# Patient Record
Sex: Male | Born: 1965 | Race: White | Hispanic: No | Marital: Married | Smoking: Never smoker
Health system: Southern US, Community
[De-identification: ages and names within clinical notes are randomized; demographics above are authoritative.]

## PROBLEM LIST (undated history)

## (undated) DIAGNOSIS — R011 Cardiac murmur, unspecified: Secondary | ICD-10-CM

## (undated) DIAGNOSIS — E119 Type 2 diabetes mellitus without complications: Secondary | ICD-10-CM

## (undated) DIAGNOSIS — I498 Other specified cardiac arrhythmias: Secondary | ICD-10-CM

## (undated) DIAGNOSIS — E785 Hyperlipidemia, unspecified: Secondary | ICD-10-CM

## (undated) DIAGNOSIS — I10 Essential (primary) hypertension: Secondary | ICD-10-CM

## (undated) HISTORY — PX: TONSILLECTOMY: SUR1361

## (undated) HISTORY — DX: Hyperlipidemia, unspecified: E78.5

## (undated) HISTORY — DX: Essential (primary) hypertension: I10

## (undated) HISTORY — DX: Cardiac murmur, unspecified: R01.1

## (undated) HISTORY — PX: APPENDECTOMY: SHX54

## (undated) HISTORY — DX: Type 2 diabetes mellitus without complications: E11.9

## (undated) HISTORY — DX: Other specified cardiac arrhythmias: I49.8

---

## 2011-02-12 ENCOUNTER — Emergency Department (HOSPITAL_COMMUNITY)
Admission: EM | Admit: 2011-02-12 | Discharge: 2011-02-12 | Discharge status: Absent without leave | Payer: 59 | Source: Home / Self Care | Attending: Emergency Medicine | Admitting: Emergency Medicine

## 2011-02-12 NOTE — ED Provider Notes (Signed)
The patient was never seen and left before triage.  Roque Lias, MD 02/12/11 [phone]

## 2012-08-26 ENCOUNTER — Other Ambulatory Visit: Payer: Self-pay | Admitting: Family Medicine

## 2012-08-28 ENCOUNTER — Other Ambulatory Visit: Payer: Self-pay | Admitting: Family Medicine

## 2012-08-28 NOTE — Telephone Encounter (Signed)
Med rf °

## 2012-12-12 ENCOUNTER — Telehealth: Payer: Self-pay | Admitting: Family Medicine

## 2012-12-12 MED ORDER — METFORMIN HCL 1000 MG PO TABS: 1000.0000 mg | ORAL_TABLET | Freq: Two times a day (BID) | ORAL | Status: DC

## 2012-12-12 NOTE — Telephone Encounter (Signed)
Rx Refilled  

## 2012-12-12 NOTE — Telephone Encounter (Signed)
Metformin HCL 1000 mg tab 1 BID #60 °

## 2013-01-09 ENCOUNTER — Other Ambulatory Visit: Payer: Self-pay | Admitting: Family Medicine

## 2013-01-26 ENCOUNTER — Other Ambulatory Visit: Payer: Self-pay | Admitting: Family Medicine

## 2013-01-29 ENCOUNTER — Other Ambulatory Visit: Payer: Self-pay | Admitting: Family Medicine

## 2013-01-29 MED ORDER — METFORMIN HCL 1000 MG PO TABS: 1000.0000 mg | ORAL_TABLET | Freq: Two times a day (BID) | ORAL | Status: DC

## 2013-01-29 NOTE — Telephone Encounter (Signed)
Rx Refilled  

## 2013-02-07 ENCOUNTER — Ambulatory Visit: Payer: Self-pay | Admitting: Physician Assistant

## 2013-03-19 ENCOUNTER — Other Ambulatory Visit: Payer: Self-pay | Admitting: Family Medicine

## 2013-04-02 ENCOUNTER — Other Ambulatory Visit: Payer: Self-pay | Admitting: Family Medicine

## 2013-04-02 MED ORDER — PERMETHRIN 5 % EX CREA: 1.0000 "application " | TOPICAL_CREAM | Freq: Once | CUTANEOUS | Status: DC

## 2013-04-11 ENCOUNTER — Encounter: Payer: Self-pay | Admitting: Family Medicine

## 2013-04-11 ENCOUNTER — Ambulatory Visit (INDEPENDENT_AMBULATORY_CARE_PROVIDER_SITE_OTHER): Payer: 59 | Admitting: Family Medicine

## 2013-04-11 VITALS — BP 110/80 | HR 68 | Temp 97.1°F | Resp 18 | Ht 71.0 in | Wt 272.0 lb

## 2013-04-11 DIAGNOSIS — E119 Type 2 diabetes mellitus without complications: Secondary | ICD-10-CM

## 2013-04-11 LAB — LIPID PANEL
CHOLESTEROL: 188 mg/dL (ref 0–200)
HDL: 22 mg/dL — ABNORMAL LOW (ref >39)
LDL Cholesterol: 94 mg/dL (ref 0–99)
Total CHOL/HDL Ratio: 8.5 Ratio
Triglycerides: 361 mg/dL — ABNORMAL HIGH (ref <150)
VLDL: 72 mg/dL — ABNORMAL HIGH (ref 0–40)

## 2013-04-11 LAB — COMPLETE METABOLIC PANEL WITH GFR
ALBUMIN: 4.5 g/dL (ref 3.5–5.2)
ALT: 38 U/L (ref 0–53)
AST: 22 U/L (ref 0–37)
Alkaline Phosphatase: 70 U/L (ref 39–117)
BILIRUBIN TOTAL: 0.4 mg/dL (ref 0.3–1.2)
BUN: 12 mg/dL (ref 6–23)
CHLORIDE: 103 meq/L (ref 96–112)
CO2: 25 meq/L (ref 19–32)
Calcium: 9.3 mg/dL (ref 8.4–10.5)
Creat: 0.51 mg/dL (ref 0.50–1.35)
GLUCOSE: 140 mg/dL — AB (ref 70–99)
POTASSIUM: 4.7 meq/L (ref 3.5–5.3)
SODIUM: 141 meq/L (ref 135–145)
TOTAL PROTEIN: 6.8 g/dL (ref 6.0–8.3)

## 2013-04-11 LAB — HEMOGLOBIN A1C
HEMOGLOBIN A1C: 8.4 % — AB (ref <5.7)
MEAN PLASMA GLUCOSE: 194 mg/dL — AB (ref <117)

## 2013-04-11 NOTE — Progress Notes (Signed)
   Subjective:    Patient ID: Isaiah Beasley, male    DOB: [birth year], 48 y.o.   MRN: [id number]  HPI Patient comes in for recheck of his diabetes. Currently taking glipizide XL 10 mg by mouth daily, metformin 1000 mg by mouth twice a day, and victoza 18 mg subcutaneous daily.  His fasting blood sugars are 04/27/1938. His postprandial sugars are 150-160. He is not having any hypoglycemia." He has not experienced any weight loss since starting victoza.  He is also interested in medication for weight loss.  He states he has trouble controlling his appetite. He frequently eats and is not even hungry. Past Medical History  Diagnosis Date  . Diabetes mellitus without complication   . Hypertension   . Hyperlipidemia    Current Outpatient Prescriptions on File Prior to Visit  Medication Sig Dispense Refill  . acyclovir (ZOVIRAX) 400 MG tablet TAKE 1 TABLET TWICE A DAY  60 tablet  1  . glipiZIDE (GLUCOTROL XL) 10 MG 24 hr tablet TAKE 1 TABLET BY MOUTH DAILY  30 tablet  5  . metFORMIN (GLUCOPHAGE) 1000 MG tablet Take 1 tablet (1,000 mg total) by mouth 2 (two) times daily with a meal.  60 tablet  3  . VICTOZA 18 MG/3ML SOPN INJECT 1.8 MGS DAILY  9 pen  5   No current facility-administered medications on file prior to visit.   No Known Allergies History   Social History  . Marital Status: Married    Spouse Name: N/A    Number of Children: N/A  . Years of Education: N/A   Occupational History  . Not on file.   Social History Main Topics  . Smoking status: Never Smoker   . Smokeless tobacco: Not on file  . Alcohol Use: Yes     Comment: Rare  . Drug Use: No  . Sexual Activity: Not on file   Other Topics Concern  . Not on file   Social History Narrative  . No narrative on file      Review of Systems  All other systems reviewed and are negative.       Objective:   Physical Exam  Vitals reviewed. Constitutional: He appears well-developed and well-nourished. No distress.    HENT:  Mouth/Throat: Oropharynx is clear and moist. No oropharyngeal exudate.  Eyes: Conjunctivae are normal.  Neck: Neck supple. No JVD present. No thyromegaly present.  Cardiovascular: Normal rate, regular rhythm, normal heart sounds and intact distal pulses.  Exam reveals no gallop and no friction rub.   No murmur heard. Pulmonary/Chest: Effort normal and breath sounds normal. No respiratory distress. He has no wheezes. He has no rales. He exhibits no tenderness.  Abdominal: Soft. Bowel sounds are normal. He exhibits no distension. There is no tenderness. There is no rebound and no guarding.  Musculoskeletal: He exhibits no edema.  Lymphadenopathy:    He has no cervical adenopathy.  Skin: He is not diaphoretic.          Assessment & Plan:  1. Type II or unspecified type diabetes mellitus without mention of complication, not stated as uncontrolled Discontinue glipizide.  Replace with Jardience 25 mg poqday.  Recheck hemoglobin A1c in 3 months on medication. He is not able to tolerate the medication, I would resume glipizide and start the patient on either topamax or adipex as an appetite suppressant. - COMPLETE METABOLIC PANEL WITH GFR - Lipid panel - Hemoglobin A1c

## 2013-05-02 ENCOUNTER — Telehealth: Payer: Self-pay | Admitting: *Deleted

## 2013-05-02 ENCOUNTER — Other Ambulatory Visit: Payer: Self-pay | Admitting: Family Medicine

## 2013-05-02 MED ORDER — EMPAGLIFLOZIN 25 MG PO TABS: 25.0000 mg | ORAL_TABLET | Freq: Every day | ORAL | Status: DC

## 2013-05-02 NOTE — Telephone Encounter (Signed)
Sure, I will rx but he needs to get the rebate card we have to make it free, otherwise it will be very expensive.

## 2013-05-02 NOTE — Telephone Encounter (Signed)
Pt called stating that you had given him samples of Jardiance at last ov, wants to know if you can write a prescription for him or call it in at his pharmacy?

## 2013-05-03 MED ORDER — EMPAGLIFLOZIN 25 MG PO TABS: 25.0000 mg | ORAL_TABLET | Freq: Every day | ORAL | Status: DC

## 2013-05-03 NOTE — Telephone Encounter (Signed)
Meds refilled.

## 2013-05-03 NOTE — Telephone Encounter (Signed)
Pt aware of message

## 2013-08-02 ENCOUNTER — Other Ambulatory Visit: Payer: 59

## 2013-08-02 DIAGNOSIS — Z0000 Encounter for general adult medical examination without abnormal findings: Secondary | ICD-10-CM

## 2013-08-02 DIAGNOSIS — Z79899 Other long term (current) drug therapy: Secondary | ICD-10-CM

## 2013-08-02 DIAGNOSIS — E119 Type 2 diabetes mellitus without complications: Secondary | ICD-10-CM

## 2013-08-02 LAB — COMPLETE METABOLIC PANEL WITH GFR
ALBUMIN: 4.3 g/dL (ref 3.5–5.2)
ALT: 19 U/L (ref 0–53)
AST: 15 U/L (ref 0–37)
Alkaline Phosphatase: 69 U/L (ref 39–117)
BUN: 13 mg/dL (ref 6–23)
CALCIUM: 8.9 mg/dL (ref 8.4–10.5)
CHLORIDE: 103 meq/L (ref 96–112)
CO2: 23 mEq/L (ref 19–32)
Creat: 0.58 mg/dL (ref 0.50–1.35)
GFR, Est Non African American: >89 mL/min
Glucose, Bld: 155 mg/dL — ABNORMAL HIGH (ref 70–99)
POTASSIUM: 4.3 meq/L (ref 3.5–5.3)
Sodium: 139 mEq/L (ref 135–145)
Total Bilirubin: 0.4 mg/dL (ref 0.2–1.2)
Total Protein: 6.6 g/dL (ref 6.0–8.3)

## 2013-08-02 LAB — HEMOGLOBIN A1C
Hgb A1c MFr Bld: 7.5 % — ABNORMAL HIGH (ref <5.7)
MEAN PLASMA GLUCOSE: 169 mg/dL — AB (ref <117)

## 2013-08-02 LAB — LIPID PANEL
Cholesterol: 210 mg/dL — ABNORMAL HIGH (ref 0–200)
HDL: 25 mg/dL — AB (ref >39)
Total CHOL/HDL Ratio: 8.4 Ratio
Triglycerides: 555 mg/dL — ABNORMAL HIGH (ref <150)

## 2013-08-09 ENCOUNTER — Encounter: Payer: Self-pay | Admitting: Family Medicine

## 2013-08-09 ENCOUNTER — Ambulatory Visit (INDEPENDENT_AMBULATORY_CARE_PROVIDER_SITE_OTHER): Payer: 59 | Admitting: Family Medicine

## 2013-08-09 VITALS — BP 110/70 | HR 84 | Temp 97.0°F | Resp 18 | Ht 71.0 in | Wt 265.0 lb

## 2013-08-09 DIAGNOSIS — E119 Type 2 diabetes mellitus without complications: Secondary | ICD-10-CM

## 2013-08-09 DIAGNOSIS — E781 Pure hyperglyceridemia: Secondary | ICD-10-CM

## 2013-08-09 MED ORDER — TOPIRAMATE 25 MG PO TABS: 50.0000 mg | ORAL_TABLET | Freq: Two times a day (BID) | ORAL | Status: DC

## 2013-08-09 NOTE — Progress Notes (Signed)
Subjective:    Patient ID: Isaiah Beasley, male    DOB: [birth year], 48 y.o.   MRN: [id number]  HPI 1/15 Patient comes in for recheck of his diabetes. Currently taking glipizide XL 10 mg by mouth daily, metformin 1000 mg by mouth twice a day, and victoza 18 mg subcutaneous daily.  His fasting blood sugars are 04/27/1938. His postprandial sugars are 150-160. He is not having any hypoglycemia." He has not experienced any weight loss since starting victoza.  He is also interested in medication for weight loss.  He states he has trouble controlling his appetite. He frequently eats and is not even hungry.  At that time, my plan was: 1. Type II or unspecified type diabetes mellitus without mention of complication, not stated as uncontrolled Discontinue glipizide.  Replace with Jardience 25 mg poqday.  Recheck hemoglobin A1c in 3 months on medication. He is not able to tolerate the medication, I would resume glipizide and start the patient on either topamax or adipex as an appetite suppressant. - COMPLETE METABOLIC PANEL WITH GFR - Lipid panel - Hemoglobin A1c  08/09/13 Patient has lost 7 pounds since his last office visit. His hemoglobin A1c has fallen from 8.4-7.5. Unfortunately is still high. The patient continues to keep when he is not hungry. He would like to start taking an appetite suppressant. Unfortunately his triglycerides did increase after we started the patient on jardiance as one could anticipate. Lab on 08/02/2013  Component Date Value Ref Range Status  . Cholesterol 08/02/2013 210* 0 - 200 mg/dL Final   Comment: ATP III Classification:                                < 200        mg/dL        Desirable                               200 - 239     mg/dL        Borderline High                               >= 240        mg/dL        High                             . Triglycerides 08/02/2013 555* <150 mg/dL Final  . HDL 08/02/2013 25* >39 mg/dL Final  . Total CHOL/HDL Ratio 08/02/2013  8.4   Final  . VLDL 08/02/2013 NOT CALC  0 - 40 mg/dL Final   Comment:                            Not calculated due to Triglyceride >400.                          Suggest ordering Direct LDL (Unit Code: [phone]).  . LDL Cholesterol 08/02/2013 NOT CALC  0 - 99 mg/dL Final   Comment:                            Not calculated due to Triglyceride >400.  Suggest ordering Direct LDL (Unit Code: [phone]).                                                     Total Cholesterol/HDL Ratio:CHD Risk                                                 Coronary Heart Disease Risk Table                                                                 Men       Women                                   1/2 Average Risk              3.4        3.3                                       Average Risk              5.0        4.4                                    2X Average Risk              9.6        7.1                                    3X Average Risk             23.4       11.0                          Use the calculated Patient Ratio above and the CHD Risk table                           to determine the patient's CHD Risk.                          ATP III Classification (LDL):                                < 100        mg/dL         Optimal  100 - 129     mg/dL         Near or Above Optimal                               130 - 159     mg/dL         Borderline High                               160 - 189     mg/dL         High                                > 190        mg/dL         Very High                             . Hemoglobin A1C 08/02/2013 7.5* <5.7 % Final   Comment:                                                                                                 According to the ADA Clinical Practice Recommendations for 2011, when                          HbA1c is used as a screening test:                                                       >=6.5%    Diagnostic of Diabetes Mellitus                                     (if abnormal result is confirmed)                                                     5.7-6.4%   Increased risk of developing Diabetes Mellitus                                                     References:Diagnosis and Classification of Diabetes Mellitus,Diabetes                          IWLN,9892,11(HERDE 1):S62-S69 and Standards of Medical Care in  Diabetes - 2011,Diabetes Care,2011,34 (Suppl 1):S11-S61.                             . Mean Plasma Glucose 08/02/2013 169* <117 mg/dL Final  . Sodium 08/02/2013 139  135 - 145 mEq/L Final  . Potassium 08/02/2013 4.3  3.5 - 5.3 mEq/L Final  . Chloride 08/02/2013 103  96 - 112 mEq/L Final  . CO2 08/02/2013 23  19 - 32 mEq/L Final  . Glucose, Bld 08/02/2013 155* 70 - 99 mg/dL Final  . BUN 08/02/2013 13  6 - 23 mg/dL Final  . Creat 08/02/2013 0.58  0.50 - 1.35 mg/dL Final  . Total Bilirubin 08/02/2013 0.4  0.2 - 1.2 mg/dL Final  . Alkaline Phosphatase 08/02/2013 69  39 - 117 U/L Final  . AST 08/02/2013 15  0 - 37 U/L Final  . ALT 08/02/2013 19  0 - 53 U/L Final  . Total Protein 08/02/2013 6.6  6.0 - 8.3 g/dL Final  . Albumin 08/02/2013 4.3  3.5 - 5.2 g/dL Final  . Calcium 08/02/2013 8.9  8.4 - 10.5 mg/dL Final  . GFR, Est African American 08/02/2013 >89   Final  . GFR, Est Non African American 08/02/2013 >89   Final   Comment:                            The estimated GFR is a calculation valid for adults (>=1 years old)                          that uses the CKD-EPI algorithm to adjust for age and sex. It is                            not to be used for children, pregnant women, hospitalized patients,                             patients on dialysis, or with rapidly changing kidney function.                          According to the NKDEP, eGFR >89 is normal, 60-89 shows mild                          impairment, 30-59 shows moderate  impairment, 15-29 shows severe                          impairment and <15 is ESRD.                               Past Medical History  Diagnosis Date  . Diabetes mellitus without complication   . Hypertension   . Hyperlipidemia    Current Outpatient Prescriptions on File Prior to Visit  Medication Sig Dispense Refill  . Empagliflozin (JARDIANCE) 25 MG TABS Take 25 mg by mouth daily.  30 tablet  11  . metFORMIN (GLUCOPHAGE) 1000 MG tablet Take 1 tablet (1,000 mg total) by mouth 2 (two) times daily with a meal.  60 tablet  3  . Omega-3 Fatty Acids (FISH OIL) 1000 MG CAPS Take by  mouth.      Marland Kitchen VICTOZA 18 MG/3ML SOPN INJECT 1.8 MGS DAILY  9 pen  5  . acyclovir (ZOVIRAX) 400 MG tablet TAKE 1 TABLET TWICE A DAY  60 tablet  1   No current facility-administered medications on file prior to visit.   No Known Allergies History   Social History  . Marital Status: Married    Spouse Name: N/A    Number of Children: N/A  . Years of Education: N/A   Occupational History  . Not on file.   Social History Main Topics  . Smoking status: Never Smoker   . Smokeless tobacco: Not on file  . Alcohol Use: Yes     Comment: Rare  . Drug Use: No  . Sexual Activity: Not on file   Other Topics Concern  . Not on file   Social History Narrative  . No narrative on file      Review of Systems  All other systems reviewed and are negative.      Objective:   Physical Exam  Vitals reviewed. Cardiovascular: Normal rate, regular rhythm, normal heart sounds and intact distal pulses.  Exam reveals no gallop and no friction rub.   No murmur heard. Pulmonary/Chest: Effort normal and breath sounds normal. No respiratory distress. He has no wheezes. He has no rales. He exhibits no tenderness.  Abdominal: Soft. Bowel sounds are normal. He exhibits no distension. There is no tenderness. There is no rebound and no guarding.          Assessment & Plan:  1. Type II or unspecified type diabetes  mellitus without mention of complication, not stated as uncontrolled Patient does not want to add any medicine at this time. He like to try to lose weight to see if he can get the remainder of his blood sugars under control.  Therefore, we are going to start patient on Topamax for weight loss.  Again Topamax 25 mg by mouth each bedtime and increase by 25 mg every week until he is taking 50 mg by mouth twice a day. Recheck weight in 3 months along with a hemoglobin A1c.  2. Hypertriglyceridemia Patient refuses any medication to help control his cholesterol. He wants to try losing weight first.

## 2013-09-27 ENCOUNTER — Other Ambulatory Visit: Payer: Self-pay | Admitting: Family Medicine

## 2014-01-08 ENCOUNTER — Other Ambulatory Visit: Payer: Self-pay | Admitting: Family Medicine

## 2014-01-29 ENCOUNTER — Encounter: Payer: Self-pay | Admitting: Family Medicine

## 2014-01-29 ENCOUNTER — Other Ambulatory Visit: Payer: Self-pay | Admitting: Family Medicine

## 2014-01-29 NOTE — Telephone Encounter (Signed)
Medication refill for one time only.  Patient needs to be seen.  Letter sent for patient to call and schedule 

## 2014-02-28 ENCOUNTER — Other Ambulatory Visit: Payer: 59

## 2014-02-28 DIAGNOSIS — E785 Hyperlipidemia, unspecified: Secondary | ICD-10-CM

## 2014-02-28 DIAGNOSIS — E119 Type 2 diabetes mellitus without complications: Secondary | ICD-10-CM

## 2014-02-28 DIAGNOSIS — Z79899 Other long term (current) drug therapy: Secondary | ICD-10-CM

## 2014-03-03 ENCOUNTER — Other Ambulatory Visit: Payer: Self-pay | Admitting: Family Medicine

## 2014-03-03 ENCOUNTER — Ambulatory Visit (INDEPENDENT_AMBULATORY_CARE_PROVIDER_SITE_OTHER): Payer: 59 | Admitting: Family Medicine

## 2014-03-03 ENCOUNTER — Other Ambulatory Visit: Payer: 59

## 2014-03-03 ENCOUNTER — Encounter: Payer: Self-pay | Admitting: Family Medicine

## 2014-03-03 VITALS — BP 110/68 | HR 88 | Temp 98.0°F | Resp 18 | Ht 71.0 in | Wt 262.0 lb

## 2014-03-03 DIAGNOSIS — E1165 Type 2 diabetes mellitus with hyperglycemia: Secondary | ICD-10-CM

## 2014-03-03 DIAGNOSIS — E119 Type 2 diabetes mellitus without complications: Secondary | ICD-10-CM

## 2014-03-03 DIAGNOSIS — R5382 Chronic fatigue, unspecified: Secondary | ICD-10-CM

## 2014-03-03 DIAGNOSIS — E781 Pure hyperglyceridemia: Secondary | ICD-10-CM

## 2014-03-03 DIAGNOSIS — IMO0002 Reserved for concepts with insufficient information to code with codable children: Secondary | ICD-10-CM

## 2014-03-03 LAB — LIPID PANEL
Cholesterol: 201 mg/dL — ABNORMAL HIGH (ref 0–200)
HDL: 30 mg/dL — ABNORMAL LOW (ref >39)
LDL Cholesterol: 104 mg/dL — ABNORMAL HIGH (ref 0–99)
Total CHOL/HDL Ratio: 6.7 Ratio
Triglycerides: 335 mg/dL — ABNORMAL HIGH (ref <150)
VLDL: 67 mg/dL — AB (ref 0–40)

## 2014-03-03 LAB — CBC WITH DIFFERENTIAL/PLATELET
BASOS ABS: 0.1 10*3/uL (ref 0.0–0.1)
BASOS PCT: 1 % (ref 0–1)
EOS ABS: 0.1 10*3/uL (ref 0.0–0.7)
Eosinophils Relative: 2 % (ref 0–5)
HCT: 46.9 % (ref 39.0–52.0)
Hemoglobin: 15.9 g/dL (ref 13.0–17.0)
Lymphocytes Relative: 38 % (ref 12–46)
Lymphs Abs: 2.7 10*3/uL (ref 0.7–4.0)
MCH: 29.6 pg (ref 26.0–34.0)
MCHC: 33.9 g/dL (ref 30.0–36.0)
MCV: 87.3 fL (ref 78.0–100.0)
MONO ABS: 0.6 10*3/uL (ref 0.1–1.0)
MONOS PCT: 9 % (ref 3–12)
MPV: 10.3 fL (ref 9.4–12.4)
NEUTROS PCT: 50 % (ref 43–77)
Neutro Abs: 3.5 10*3/uL (ref 1.7–7.7)
Platelets: 241 10*3/uL (ref 150–400)
RBC: 5.37 MIL/uL (ref 4.22–5.81)
RDW: 14.6 % (ref 11.5–15.5)
WBC: 7 10*3/uL (ref 4.0–10.5)

## 2014-03-03 LAB — COMPLETE METABOLIC PANEL WITH GFR
ALT: 25 U/L (ref 0–53)
AST: 17 U/L (ref 0–37)
Albumin: 4.3 g/dL (ref 3.5–5.2)
Alkaline Phosphatase: 65 U/L (ref 39–117)
BUN: 12 mg/dL (ref 6–23)
CALCIUM: 9.6 mg/dL (ref 8.4–10.5)
CO2: 25 mEq/L (ref 19–32)
Chloride: 103 mEq/L (ref 96–112)
Creat: 0.64 mg/dL (ref 0.50–1.35)
GFR, Est African American: >89 mL/min
GFR, Est Non African American: >89 mL/min
Glucose, Bld: 183 mg/dL — ABNORMAL HIGH (ref 70–99)
POTASSIUM: 4.7 meq/L (ref 3.5–5.3)
SODIUM: 142 meq/L (ref 135–145)
TOTAL PROTEIN: 6.6 g/dL (ref 6.0–8.3)
Total Bilirubin: 0.5 mg/dL (ref 0.2–1.2)

## 2014-03-03 LAB — HEMOGLOBIN A1C, FINGERSTICK: Hgb A1C (fingerstick): 7 % — ABNORMAL HIGH (ref <5.7)

## 2014-03-03 NOTE — Progress Notes (Signed)
Subjective:    Patient ID: Isaiah Beasley, male    DOB: [birth year], 48 y.o.   MRN: [id number]  HPI Patient is here today for follow-up of his chronic medical conditions. Patient's hemoglobin A1c has fallen to 7.0. Unfortunately he has not lost any weight. Patient discontinue Topamax due to side effects. He continues to complain of severe fatigue. He denies any erectile dysfunction or decreased libido. His wife has reported that he snores at night and has made gasping noises in his sleep. Patient has never had his thyroid or testosterone level checked. His blood pressures well controlled today. He is overdue for fasting lipid panel. He has a history of hypertriglyceridemia but he refuses to take any cholesterol medication. I am concerned that the jardiance may exacerbate his hyperlipidemia. He denies any polyuria, polydipsia, or blurred vision. His diabetic foot exam is up-to-date. He is due for diabetic eye exam. Past Medical History  Diagnosis Date  . Diabetes mellitus without complication   . Hypertension   . Hyperlipidemia    No past surgical history on file. Current Outpatient Prescriptions on File Prior to Visit  Medication Sig Dispense Refill  . acyclovir (ZOVIRAX) 400 MG tablet TAKE 1 TABLET TWICE A DAY 60 tablet 1  . Empagliflozin (JARDIANCE) 25 MG TABS Take 25 mg by mouth daily. 30 tablet 11  . metFORMIN (GLUCOPHAGE) 1000 MG tablet TAKE 1 TABLET BY MOUTH 2 TIMES DAILY WITH A MEAL. 60 tablet 0  . Omega-3 Fatty Acids (FISH OIL) 1000 MG CAPS Take by mouth.    . topiramate (TOPAMAX) 25 MG tablet TAKE 2 TABLETS (50 MG TOTAL) BY MOUTH 2 (TWO) TIMES DAILY. 120 tablet 3  . VICTOZA 18 MG/3ML SOPN INJECT 1.8 MGS DAILY 9 pen 5   No current facility-administered medications on file prior to visit.   No Known Allergies History   Social History  . Marital Status: Married    Spouse Name: N/A    Number of Children: N/A  . Years of Education: N/A   Occupational History  . Not on file.    Social History Main Topics  . Smoking status: Never Smoker   . Smokeless tobacco: Not on file  . Alcohol Use: Yes     Comment: Rare  . Drug Use: No  . Sexual Activity: Not on file   Other Topics Concern  . Not on file   Social History Narrative      Review of Systems  All other systems reviewed and are negative.      Objective:   Physical Exam  Constitutional: He appears well-developed and well-nourished.  Cardiovascular: Normal rate, regular rhythm, normal heart sounds and intact distal pulses.  Exam reveals no gallop and no friction rub.   No murmur heard. Pulmonary/Chest: Effort normal and breath sounds normal. No respiratory distress. He has no wheezes. He has no rales. He exhibits no tenderness.  Abdominal: Soft. Bowel sounds are normal. He exhibits no distension and no mass. There is no tenderness. There is no rebound and no guarding.  Vitals reviewed.         Assessment & Plan:  Chronic fatigue - Plan: TSH, Testosterone  Hypertriglyceridemia  Diabetes mellitus type II, uncontrolled  Patient's blood pressures well controlled. His diabetes is acceptable. I will check a fasting lipid panel. If his triglycerides are extremely elevated, we will need to discontinue jardiance and switch to tradjenta.  I have recommended diet exercise and weight loss. If the patient's lipids are extremely uncontrolled, I would  strongly recommend he try a cholesterol medication. The patient will consider it. Due to his fatigue will check a TSH and testosterone level. If those are normal, I would recommend scheduling the patient for a split-level sleep study to evaluate for obstructive sleep apnea. Also schedule the patient to see an opthalmologist.

## 2014-03-04 LAB — TESTOSTERONE: Testosterone: 158 ng/dL — ABNORMAL LOW (ref 300–890)

## 2014-03-04 LAB — TSH: TSH: 1.746 u[IU]/mL (ref 0.350–4.500)

## 2014-03-06 ENCOUNTER — Other Ambulatory Visit: Payer: Self-pay | Admitting: *Deleted

## 2014-03-06 DIAGNOSIS — R7989 Other specified abnormal findings of blood chemistry: Secondary | ICD-10-CM

## 2014-03-07 ENCOUNTER — Other Ambulatory Visit: Payer: 59

## 2014-03-07 DIAGNOSIS — R7989 Other specified abnormal findings of blood chemistry: Secondary | ICD-10-CM

## 2014-03-08 LAB — FSH/LH
FSH: 4.8 m[IU]/mL (ref 1.4–18.1)
LH: 2.1 m[IU]/mL (ref 1.5–9.3)

## 2014-03-08 LAB — PROLACTIN: Prolactin: 4.4 ng/mL (ref 2.1–17.1)

## 2014-03-08 LAB — TESTOSTERONE: Testosterone: 159 ng/dL — ABNORMAL LOW (ref 300–890)

## 2014-03-11 ENCOUNTER — Telehealth: Payer: Self-pay | Admitting: *Deleted

## 2014-03-11 NOTE — Telephone Encounter (Signed)
Recieved fax from [facility] eye care stating that pt states has a eye doctor already and had diabetic exam done earlier this year, I called pt to find out where he had done and stated was done at [facility], I called and spoke with Juliann Pulse at [facility] and she is going to fax over his last ov and results tomorrow morning.

## 2014-03-24 ENCOUNTER — Encounter: Payer: Self-pay | Admitting: Family Medicine

## 2014-03-24 ENCOUNTER — Other Ambulatory Visit: Payer: Self-pay | Admitting: Family Medicine

## 2014-03-24 ENCOUNTER — Ambulatory Visit (INDEPENDENT_AMBULATORY_CARE_PROVIDER_SITE_OTHER): Payer: 59 | Admitting: Family Medicine

## 2014-03-24 VITALS — BP 126/72 | HR 92 | Temp 98.0°F | Resp 18 | Ht 71.0 in | Wt 266.0 lb

## 2014-03-24 DIAGNOSIS — E291 Testicular hypofunction: Secondary | ICD-10-CM

## 2014-03-24 MED ORDER — TESTOSTERONE CYPIONATE 200 MG/ML IM SOLN: 200.0000 mg | INTRAMUSCULAR | Status: DC

## 2014-03-24 NOTE — Telephone Encounter (Signed)
Refill appropriate and filled per protocol. 

## 2014-03-24 NOTE — Progress Notes (Signed)
   Subjective:    Patient ID: Isaiah Beasley, male    DOB: 20-Sep-1965, 48 y.o.   MRN: [id number]  HPI Before meals my previous office visits. Patient is a very pleasant 48 year old white male with a history of metabolic syndrome, type 2 diabetes mellitus, dyslipidemia, and obesity. He's been complaining about severe fatigue, apathy, low energy for several months. Recently checked a total testosterone level that was low at 158. A repeat testosterone level confirmed at 159. Patient's [place] LH and prolactin levels were normal however. He is here today to discuss treatment options Past Medical History  Diagnosis Date  . Diabetes mellitus without complication   . Hypertension   . Hyperlipidemia    No past surgical history on file. Current Outpatient Prescriptions on File Prior to Visit  Medication Sig Dispense Refill  . acyclovir (ZOVIRAX) 400 MG tablet TAKE 1 TABLET TWICE A DAY 60 tablet 1  . Empagliflozin (JARDIANCE) 25 MG TABS Take 25 mg by mouth daily. 30 tablet 11  . metFORMIN (GLUCOPHAGE) 1000 MG tablet TAKE 1 TABLET BY MOUTH 2 TIMES DAILY WITH A MEAL. 60 tablet 0  . Omega-3 Fatty Acids (FISH OIL) 1000 MG CAPS Take by mouth.    Marland Kitchen VICTOZA 18 MG/3ML SOPN INJECT 1.8 MGS DAILY 9 pen 5   No current facility-administered medications on file prior to visit.   No Known Allergies History   Social History  . Marital Status: Married    Spouse Name: N/A    Number of Children: N/A  . Years of Education: N/A   Occupational History  . Not on file.   Social History Main Topics  . Smoking status: Never Smoker   . Smokeless tobacco: Not on file  . Alcohol Use: Yes     Comment: Rare  . Drug Use: No  . Sexual Activity: Not on file   Other Topics Concern  . Not on file   Social History Narrative      Review of Systems  All other systems reviewed and are negative.      Objective:   Physical Exam  Cardiovascular: Normal rate and regular rhythm.   No murmur heard. Pulmonary/Chest:  Effort normal and breath sounds normal. No respiratory distress. He has no wheezes. He has no rales.  Vitals reviewed.         Assessment & Plan:  Hypogonadism in male - Plan: testosterone cypionate (DEPOTESTOTERONE CYPIONATE) 200 MG/ML injection  Patient symptoms are consistent with hypogonadism. I will start the patient on testosterone cypionate injections 200 mg every 2 weeks IM. Recheck the patient in 3 months. At that time I like to check a CBC, PSA, testosterone level, hemoglobin A1c, and fasting lipid panel. I discussed the risk of cardiovascular disease, prostate cancer, and other side effects of testosterone and the patient would like to proceed with therapy.

## 2014-04-27 ENCOUNTER — Other Ambulatory Visit: Payer: Self-pay | Admitting: Family Medicine

## 2014-06-05 ENCOUNTER — Other Ambulatory Visit: Payer: 59

## 2014-06-05 DIAGNOSIS — Z79899 Other long term (current) drug therapy: Secondary | ICD-10-CM

## 2014-06-05 DIAGNOSIS — E785 Hyperlipidemia, unspecified: Secondary | ICD-10-CM

## 2014-06-05 DIAGNOSIS — E119 Type 2 diabetes mellitus without complications: Secondary | ICD-10-CM

## 2014-06-05 DIAGNOSIS — R7989 Other specified abnormal findings of blood chemistry: Secondary | ICD-10-CM

## 2014-06-05 LAB — CBC WITH DIFFERENTIAL/PLATELET
BASOS ABS: 0.1 10*3/uL (ref 0.0–0.1)
Basophils Relative: 1 % (ref 0–1)
EOS ABS: 0.1 10*3/uL (ref 0.0–0.7)
Eosinophils Relative: 2 % (ref 0–5)
HCT: 48.9 % (ref 39.0–52.0)
HEMOGLOBIN: 16.7 g/dL (ref 13.0–17.0)
LYMPHS ABS: 2.2 10*3/uL (ref 0.7–4.0)
LYMPHS PCT: 37 % (ref 12–46)
MCH: 30.4 pg (ref 26.0–34.0)
MCHC: 34.2 g/dL (ref 30.0–36.0)
MCV: 89.1 fL (ref 78.0–100.0)
MONO ABS: 0.6 10*3/uL (ref 0.1–1.0)
MPV: 10.1 fL (ref 8.6–12.4)
Monocytes Relative: 11 % (ref 3–12)
NEUTROS PCT: 49 % (ref 43–77)
Neutro Abs: 2.9 10*3/uL (ref 1.7–7.7)
Platelets: 251 10*3/uL (ref 150–400)
RBC: 5.49 MIL/uL (ref 4.22–5.81)
RDW: 14.3 % (ref 11.5–15.5)
WBC: 5.9 10*3/uL (ref 4.0–10.5)

## 2014-06-05 LAB — LIPID PANEL
CHOLESTEROL: 166 mg/dL (ref 0–200)
HDL: 18 mg/dL — ABNORMAL LOW (ref >=40)
LDL CALC: 109 mg/dL — AB (ref 0–99)
Total CHOL/HDL Ratio: 9.2 Ratio
Triglycerides: 197 mg/dL — ABNORMAL HIGH (ref <150)
VLDL: 39 mg/dL (ref 0–40)

## 2014-06-06 LAB — PSA: PSA: 0.37 ng/mL (ref <=4.00)

## 2014-06-06 LAB — HEMOGLOBIN A1C
HEMOGLOBIN A1C: 7.4 % — AB (ref <5.7)
Mean Plasma Glucose: 166 mg/dL — ABNORMAL HIGH (ref <117)

## 2014-06-06 LAB — TESTOSTERONE: Testosterone: 209 ng/dL — ABNORMAL LOW (ref 300–890)

## 2014-06-10 ENCOUNTER — Encounter: Payer: Self-pay | Admitting: Family Medicine

## 2014-06-10 ENCOUNTER — Ambulatory Visit (INDEPENDENT_AMBULATORY_CARE_PROVIDER_SITE_OTHER): Payer: 59 | Admitting: Family Medicine

## 2014-06-10 VITALS — BP 118/74 | HR 84 | Temp 97.8°F | Resp 18 | Ht 71.0 in | Wt 263.0 lb

## 2014-06-10 DIAGNOSIS — E291 Testicular hypofunction: Secondary | ICD-10-CM

## 2014-06-10 DIAGNOSIS — E1165 Type 2 diabetes mellitus with hyperglycemia: Secondary | ICD-10-CM | POA: Diagnosis not present

## 2014-06-10 DIAGNOSIS — E781 Pure hyperglyceridemia: Secondary | ICD-10-CM

## 2014-06-10 DIAGNOSIS — IMO0002 Reserved for concepts with insufficient information to code with codable children: Secondary | ICD-10-CM

## 2014-06-10 NOTE — Progress Notes (Signed)
Subjective:    Patient ID: Isaiah Beasley, male    DOB: 07/29/1965, 49 y.o.   MRN: [id number]  HPI  Patient is here today to follow-up his diabetes, his hyper triglyceridemia, his dyslipidemia, and his hypogonadism. His most recent lab work as listed below: Lab on 06/05/2014  Component Date Value Ref Range Status  . Cholesterol 06/05/2014 166  0 - 200 mg/dL Final   Comment: ATP III Classification:       < 200        mg/dL        Desirable      200 - 239     mg/dL        Borderline High      >= 240        mg/dL        High     . Triglycerides 06/05/2014 197* <150 mg/dL Final  . HDL 06/05/2014 18* >=40 mg/dL Final   ** Please note change in reference range(s). **  . Total CHOL/HDL Ratio 06/05/2014 9.2   Final  . VLDL 06/05/2014 39  0 - 40 mg/dL Final  . LDL Cholesterol 06/05/2014 109* 0 - 99 mg/dL Final   Comment:   Total Cholesterol/HDL Ratio:CHD Risk                        Coronary Heart Disease Risk Table                                        Men       Women          1/2 Average Risk              3.4        3.3              Average Risk              5.0        4.4           2X Average Risk              9.6        7.1           3X Average Risk             23.4       11.0 Use the calculated Patient Ratio above and the CHD Risk table  to determine the patient's CHD Risk. ATP III Classification (LDL):       < 100        mg/dL         Optimal      100 - 129     mg/dL         Near or Above Optimal      130 - 159     mg/dL         Borderline High      160 - 189     mg/dL         High       > 190        mg/dL         Very High     . Hgb A1c MFr Bld 06/05/2014 7.4* <5.7 % Final   Comment:  According to the ADA Clinical Practice Recommendations for 2011, when HbA1c is used as a screening test:     >=6.5%   Diagnostic of Diabetes Mellitus            (if abnormal result is confirmed)   5.7-6.4%   Increased  risk of developing Diabetes Mellitus   References:Diagnosis and Classification of Diabetes Mellitus,Diabetes KGMW,1027,25(DGUYQ 1):S62-S69 and Standards of Medical Care in         Diabetes - 2011,Diabetes IHKV,4259,56 (Suppl 1):S11-S61.     . Mean Plasma Glucose 06/05/2014 166* <117 mg/dL Final  . WBC 06/05/2014 5.9  4.0 - 10.5 K/uL Final  . RBC 06/05/2014 5.49  4.22 - 5.81 MIL/uL Final  . Hemoglobin 06/05/2014 16.7  13.0 - 17.0 g/dL Final  . HCT 06/05/2014 48.9  39.0 - 52.0 % Final  . MCV 06/05/2014 89.1  78.0 - 100.0 fL Final  . MCH 06/05/2014 30.4  26.0 - 34.0 pg Final  . MCHC 06/05/2014 34.2  30.0 - 36.0 g/dL Final  . RDW 06/05/2014 14.3  11.5 - 15.5 % Final  . Platelets 06/05/2014 251  150 - 400 K/uL Final  . MPV 06/05/2014 10.1  8.6 - 12.4 fL Final  . Neutrophils Relative % 06/05/2014 49  43 - 77 % Final  . Neutro Abs 06/05/2014 2.9  1.7 - 7.7 K/uL Final  . Lymphocytes Relative 06/05/2014 37  12 - 46 % Final  . Lymphs Abs 06/05/2014 2.2  0.7 - 4.0 K/uL Final  . Monocytes Relative 06/05/2014 11  3 - 12 % Final  . Monocytes Absolute 06/05/2014 0.6  0.1 - 1.0 K/uL Final  . Eosinophils Relative 06/05/2014 2  0 - 5 % Final  . Eosinophils Absolute 06/05/2014 0.1  0.0 - 0.7 K/uL Final  . Basophils Relative 06/05/2014 1  0 - 1 % Final  . Basophils Absolute 06/05/2014 0.1  0.0 - 0.1 K/uL Final  . Smear Review 06/05/2014 Criteria for review not met   Final  . Testosterone 06/05/2014 209* 300 - 890 ng/dL Final   Comment:           [facility] Stage       Male              Male               I              < 30 ng/dL        < 10 ng/dL               II             < 150 ng/dL       < 30 ng/dL               III            100-320 ng/dL     < 35 ng/dL               IV             200-970 ng/dL     15-40 ng/dL               V/Adult        300-890 ng/dL     10-70 ng/dL     . PSA 06/05/2014 0.37  <=4.00 ng/mL Final   Comment: Test Methodology: ECLIA PSA (Electrochemiluminescence  Immunoassay)   For PSA values from 2.5-4.0, particularly in  younger men <31 years old, the AUA and NCCN suggest testing for % Free PSA (3515) and evaluation of the rate of increase in PSA (PSA velocity).    patient's fatigue is much improved on testosterone. In that his testosterone level is still slightly low, the patient feels much better. He denies problems with his libido. He would like to continue the testosterone at the current dose. Unfortunately his Hemovac A1c has not improved area that is stable at 7.4 and his triglycerides are much better. Unfortunately his HDL cholesterol has fallen to 18. His LDL cholesterol is acceptable at close to 100. Past Medical History  Diagnosis Date  . Diabetes mellitus without complication   . Hypertension   . Hyperlipidemia    No past surgical history on file. Current Outpatient Prescriptions on File Prior to Visit  Medication Sig Dispense Refill  . acyclovir (ZOVIRAX) 400 MG tablet TAKE 1 TABLET TWICE A DAY 60 tablet 1  . aspirin 81 MG tablet Take 81 mg by mouth daily.    . Empagliflozin (JARDIANCE) 25 MG TABS Take 25 mg by mouth daily. 30 tablet 11  . metFORMIN (GLUCOPHAGE) 1000 MG tablet Take 1 tablet (1,000 mg total) by mouth 2 (two) times daily with a meal. 60 tablet 3  . Omega-3 Fatty Acids (FISH OIL) 1000 MG CAPS Take by mouth.    . testosterone cypionate (DEPOTESTOTERONE CYPIONATE) 200 MG/ML injection Inject 1 mL (200 mg total) into the muscle every 14 (fourteen) days. 10 mL 1  . VICTOZA 18 MG/3ML SOPN INJECT 1.8 MGS SUBCUTANEOUSLY EVERY DAY 9 pen 5   No current facility-administered medications on file prior to visit.   No Known Allergies History   Social History  . Marital Status: Married    Spouse Name: N/A  . Number of Children: N/A  . Years of Education: N/A   Occupational History  . Not on file.   Social History Main Topics  . Smoking status: Never Smoker   . Smokeless tobacco: Not on file  . Alcohol Use: Yes      Comment: Rare  . Drug Use: No  . Sexual Activity: Not on file   Other Topics Concern  . Not on file   Social History Narrative      Review of Systems  All other systems reviewed and are negative.      Objective:   Physical Exam  Cardiovascular: Normal rate, regular rhythm and normal heart sounds.   No murmur heard. Pulmonary/Chest: Effort normal and breath sounds normal. No respiratory distress. He has no wheezes. He has no rales.  Abdominal: Soft. Bowel sounds are normal. He exhibits no distension. There is no tenderness. There is no rebound and no guarding.  Musculoskeletal: He exhibits no edema.  Vitals reviewed.         Assessment & Plan:  Hypogonadism in male  Hypertriglyceridemia  Diabetes mellitus type II, uncontrolled  I had a long discussion with the patient. His blood pressure is excellent. He is satisfied with the current dose of testosterone so we will make no changes. I offered the patient Actos 30 mg a day to help treat his diabetes. He would like to try to lose 15 pounds over the next 6 months and then recheck his A1c at that time first. If his A1c is still elevated in 6 months we will start Actos. I have also recommended starting niacin 500 mg 3 times a day with meals to help address his low HDL. I'm very happy with  the decline in his triglycerides. I've also recommended increasing aerobic exercise and monitoring his diet and consuming a low carbohydrate/low saturated fat diet.Marland Kitchen

## 2014-06-27 ENCOUNTER — Other Ambulatory Visit: Payer: Self-pay | Admitting: Family Medicine

## 2014-09-04 ENCOUNTER — Other Ambulatory Visit: Payer: Self-pay | Admitting: Family Medicine

## 2014-11-20 ENCOUNTER — Other Ambulatory Visit: Payer: Self-pay | Admitting: Family Medicine

## 2014-11-22 ENCOUNTER — Other Ambulatory Visit: Payer: Self-pay | Admitting: Family Medicine

## 2014-12-02 ENCOUNTER — Other Ambulatory Visit: Payer: 59

## 2014-12-02 DIAGNOSIS — E785 Hyperlipidemia, unspecified: Secondary | ICD-10-CM

## 2014-12-02 DIAGNOSIS — E1165 Type 2 diabetes mellitus with hyperglycemia: Secondary | ICD-10-CM

## 2014-12-02 DIAGNOSIS — IMO0002 Reserved for concepts with insufficient information to code with codable children: Secondary | ICD-10-CM

## 2014-12-02 DIAGNOSIS — Z79899 Other long term (current) drug therapy: Secondary | ICD-10-CM

## 2014-12-02 LAB — HEMOGLOBIN A1C
Hgb A1c MFr Bld: 8 % — ABNORMAL HIGH (ref <5.7)
MEAN PLASMA GLUCOSE: 183 mg/dL — AB (ref <117)

## 2014-12-02 LAB — COMPLETE METABOLIC PANEL WITH GFR
ALBUMIN: 3.8 g/dL (ref 3.6–5.1)
ALK PHOS: 47 U/L (ref 40–115)
ALT: 17 U/L (ref 9–46)
AST: 15 U/L (ref 10–35)
BUN: 11 mg/dL (ref 7–25)
CO2: 25 mmol/L (ref 20–31)
CREATININE: 0.52 mg/dL — AB (ref 0.70–1.33)
Calcium: 8.8 mg/dL (ref 8.6–10.3)
Chloride: 104 mmol/L (ref 98–110)
Glucose, Bld: 164 mg/dL — ABNORMAL HIGH (ref 70–99)
Potassium: 4.4 mmol/L (ref 3.5–5.3)
Sodium: 140 mmol/L (ref 135–146)
Total Bilirubin: 0.4 mg/dL (ref 0.2–1.2)
Total Protein: 6.1 g/dL (ref 6.1–8.1)

## 2014-12-02 LAB — LIPID PANEL
Cholesterol: 198 mg/dL (ref 125–200)
HDL: 20 mg/dL — AB (ref >=40)
LDL Cholesterol: 134 mg/dL — ABNORMAL HIGH (ref <130)
Total CHOL/HDL Ratio: 9.9 Ratio — ABNORMAL HIGH (ref <=5.0)
Triglycerides: 222 mg/dL — ABNORMAL HIGH (ref <150)
VLDL: 44 mg/dL — ABNORMAL HIGH (ref <30)

## 2014-12-05 ENCOUNTER — Encounter: Payer: Self-pay | Admitting: Family Medicine

## 2014-12-05 ENCOUNTER — Ambulatory Visit (INDEPENDENT_AMBULATORY_CARE_PROVIDER_SITE_OTHER): Payer: 59 | Admitting: Family Medicine

## 2014-12-05 VITALS — BP 110/68 | HR 84 | Temp 97.7°F | Resp 12 | Ht 71.0 in | Wt 258.0 lb

## 2014-12-05 DIAGNOSIS — Z23 Encounter for immunization: Secondary | ICD-10-CM | POA: Diagnosis not present

## 2014-12-05 DIAGNOSIS — E781 Pure hyperglyceridemia: Secondary | ICD-10-CM

## 2014-12-05 DIAGNOSIS — IMO0002 Reserved for concepts with insufficient information to code with codable children: Secondary | ICD-10-CM

## 2014-12-05 DIAGNOSIS — E1165 Type 2 diabetes mellitus with hyperglycemia: Secondary | ICD-10-CM

## 2014-12-05 DIAGNOSIS — R5382 Chronic fatigue, unspecified: Secondary | ICD-10-CM

## 2014-12-05 DIAGNOSIS — E291 Testicular hypofunction: Secondary | ICD-10-CM

## 2014-12-05 MED ORDER — TESTOSTERONE 40.5 MG/2.5GM (1.62%) TD GEL: 1.0000 "application " | Freq: Every day | TRANSDERMAL | Status: DC

## 2014-12-05 MED ORDER — GLIPIZIDE ER 10 MG PO TB24: 10.0000 mg | ORAL_TABLET | Freq: Every day | ORAL | Status: DC

## 2014-12-05 NOTE — Progress Notes (Signed)
Subjective:    Patient ID: Isaiah Beasley, male    DOB: Aug 17, 1965, 49 y.o.   MRN: [id number]  HPI  06/10/14 Patient is here today to follow-up his diabetes, his hyper triglyceridemia, his dyslipidemia, and his hypogonadism. His most recent lab work as listed below: Lab on 12/02/2014  Component Date Value Ref Range Status  . Sodium 12/02/2014 140  135 - 146 mmol/L Final  . Potassium 12/02/2014 4.4  3.5 - 5.3 mmol/L Final  . Chloride 12/02/2014 104  98 - 110 mmol/L Final  . CO2 12/02/2014 25  20 - 31 mmol/L Final  . Glucose, Bld 12/02/2014 164* 70 - 99 mg/dL Final  . BUN 12/02/2014 11  7 - 25 mg/dL Final  . Creat 12/02/2014 0.52* 0.70 - 1.33 mg/dL Final  . Total Bilirubin 12/02/2014 0.4  0.2 - 1.2 mg/dL Final  . Alkaline Phosphatase 12/02/2014 47  40 - 115 U/L Final  . AST 12/02/2014 15  10 - 35 U/L Final  . ALT 12/02/2014 17  9 - 46 U/L Final  . Total Protein 12/02/2014 6.1  6.1 - 8.1 g/dL Final  . Albumin 12/02/2014 3.8  3.6 - 5.1 g/dL Final  . Calcium 12/02/2014 8.8  8.6 - 10.3 mg/dL Final  . GFR, Est African American 12/02/2014 >89  >=60 mL/min Final  . GFR, Est Non African American 12/02/2014 >89  >=60 mL/min Final   Comment:   The estimated GFR is a calculation valid for adults (>=32 years old) that uses the CKD-EPI algorithm to adjust for age and sex. It is   not to be used for children, pregnant women, hospitalized patients,    patients on dialysis, or with rapidly changing kidney function. According to the NKDEP, eGFR >89 is normal, 60-89 shows mild impairment, 30-59 shows moderate impairment, 15-29 shows severe impairment and <15 is ESRD.     Marland Kitchen Cholesterol 12/02/2014 198  125 - 200 mg/dL Final  . Triglycerides 12/02/2014 222* <150 mg/dL Final  . HDL 12/02/2014 20* >=40 mg/dL Final  . Total CHOL/HDL Ratio 12/02/2014 9.9* <=5.0 Ratio Final  . VLDL 12/02/2014 44* <30 mg/dL Final  . LDL Cholesterol 12/02/2014 134* <130 mg/dL Final   Comment:   Total Cholesterol/HDL  Ratio:CHD Risk                        Coronary Heart Disease Risk Table                                        Men       Women          1/2 Average Risk              3.4        3.3              Average Risk              5.0        4.4           2X Average Risk              9.6        7.1           3X Average Risk             23.4       11.0 Use  the calculated Patient Ratio above and the CHD Risk table  to determine the patient's CHD Risk.   . Hgb A1c MFr Bld 12/02/2014 8.0* <5.7 % Final   Comment:                                                                        According to the ADA Clinical Practice Recommendations for 2011, when HbA1c is used as a screening test:     >=6.5%   Diagnostic of Diabetes Mellitus            (if abnormal result is confirmed)   5.7-6.4%   Increased risk of developing Diabetes Mellitus   References:Diagnosis and Classification of Diabetes Mellitus,Diabetes HYQM,5784,69(GEXBM 1):S62-S69 and Standards of Medical Care in         Diabetes - 2011,Diabetes WUXL,2440,10 (Suppl 1):S11-S61.     . Mean Plasma Glucose 12/02/2014 183* <117 mg/dL Final   Comment:   Footnotes:  (1) ** Please note change in unit of measure and reference range(s). **      patient's fatigue is much improved on testosterone. In that his testosterone level is still slightly low, the patient feels much better. He denies problems with his libido. He would like to continue the testosterone at the current dose. Unfortunately his Hemovac A1c has not improved area that is stable at 7.4 and his triglycerides are much better. Unfortunately his HDL cholesterol has fallen to 18. His LDL cholesterol is acceptable at close to 100.  At that time, my plan was: I had a long discussion with the patient. His blood pressure is excellent. He is satisfied with the current dose of testosterone so we will make no changes. I offered the patient Actos 30 mg a day to help treat his diabetes. He would like to try  to lose 15 pounds over the next 6 months and then recheck his A1c at that time first. If his A1c is still elevated in 6 months we will start Actos. I have also recommended starting niacin 500 mg 3 times a day with meals to help address his low HDL. I'm very happy with the decline in his triglycerides. I've also recommended increasing aerobic exercise and monitoring his diet and consuming a low carbohydrate/low saturated fat diet..  12/05/14 Patient is here today for follow-up. His most recent lab work as listed below: Lab on 12/02/2014  Component Date Value Ref Range Status  . Sodium 12/02/2014 140  135 - 146 mmol/L Final  . Potassium 12/02/2014 4.4  3.5 - 5.3 mmol/L Final  . Chloride 12/02/2014 104  98 - 110 mmol/L Final  . CO2 12/02/2014 25  20 - 31 mmol/L Final  . Glucose, Bld 12/02/2014 164* 70 - 99 mg/dL Final  . BUN 12/02/2014 11  7 - 25 mg/dL Final  . Creat 12/02/2014 0.52* 0.70 - 1.33 mg/dL Final  . Total Bilirubin 12/02/2014 0.4  0.2 - 1.2 mg/dL Final  . Alkaline Phosphatase 12/02/2014 47  40 - 115 U/L Final  . AST 12/02/2014 15  10 - 35 U/L Final  . ALT 12/02/2014 17  9 - 46 U/L Final  . Total Protein 12/02/2014 6.1  6.1 - 8.1 g/dL Final  . Albumin 12/02/2014 3.8  3.6 -  5.1 g/dL Final  . Calcium 12/02/2014 8.8  8.6 - 10.3 mg/dL Final  . GFR, Est African American 12/02/2014 >89  >=60 mL/min Final  . GFR, Est Non African American 12/02/2014 >89  >=60 mL/min Final   Comment:   The estimated GFR is a calculation valid for adults (>=30 years old) that uses the CKD-EPI algorithm to adjust for age and sex. It is   not to be used for children, pregnant women, hospitalized patients,    patients on dialysis, or with rapidly changing kidney function. According to the NKDEP, eGFR >89 is normal, 60-89 shows mild impairment, 30-59 shows moderate impairment, 15-29 shows severe impairment and <15 is ESRD.     Marland Kitchen Cholesterol 12/02/2014 198  125 - 200 mg/dL Final  . Triglycerides 12/02/2014  222* <150 mg/dL Final  . HDL 12/02/2014 20* >=40 mg/dL Final  . Total CHOL/HDL Ratio 12/02/2014 9.9* <=5.0 Ratio Final  . VLDL 12/02/2014 44* <30 mg/dL Final  . LDL Cholesterol 12/02/2014 134* <130 mg/dL Final   Comment:   Total Cholesterol/HDL Ratio:CHD Risk                        Coronary Heart Disease Risk Table                                        Men       Women          1/2 Average Risk              3.4        3.3              Average Risk              5.0        4.4           2X Average Risk              9.6        7.1           3X Average Risk             23.4       11.0 Use the calculated Patient Ratio above and the CHD Risk table  to determine the patient's CHD Risk.   . Hgb A1c MFr Bld 12/02/2014 8.0* <5.7 % Final   Comment:                                                                        According to the ADA Clinical Practice Recommendations for 2011, when HbA1c is used as a screening test:     >=6.5%   Diagnostic of Diabetes Mellitus            (if abnormal result is confirmed)   5.7-6.4%   Increased risk of developing Diabetes Mellitus   References:Diagnosis and Classification of Diabetes Mellitus,Diabetes HDQQ,2297,98(XQJJH 1):S62-S69 and Standards of Medical Care in         Diabetes - 2011,Diabetes ERDE,0814,48 (Suppl 1):S11-S61.     . Mean Plasma Glucose 12/02/2014 183* <117 mg/dL Final  Comment:   Footnotes:  (1) ** Please note change in unit of measure and reference range(s). **      Hemoglobin A1c has risen from 7.4 8.0. Patient admits that he has not been exercising or monitoring his diet. He also states that his fatigue has improved dramatically since starting testosterone injections. He really sees benefit from this. However the patient states that he feels good for 1 week after the injection and in the second week he feels extremely tired and moody. He is interested in trying to daily cream in order to maintain a more consistent testosterone  level within his body. Triglycerides have risen slightly since his last office visit. However the patient attributes that to dietary indiscretions he has been making over the summer. He states that he is been extremely busy at work and he has not been adhering to a low-fat low-carb diet like he should Past Medical History  Diagnosis Date  . Diabetes mellitus without complication   . Hypertension   . Hyperlipidemia    No past surgical history on file. Current Outpatient Prescriptions on File Prior to Visit  Medication Sig Dispense Refill  . acyclovir (ZOVIRAX) 400 MG tablet TAKE 1 TABLET TWICE A DAY 60 tablet 1  . aspirin 81 MG tablet Take 81 mg by mouth daily.    Marland Kitchen JARDIANCE 25 MG TABS tablet TAKE 1 TABLET EVERY DAY 30 tablet 3  . metFORMIN (GLUCOPHAGE) 1000 MG tablet TAKE 1 TABLET TWICE A DAY WITH A MEAL 60 tablet 3  . Omega-3 Fatty Acids (FISH OIL) 1000 MG CAPS Take by mouth.    Marland Kitchen VICTOZA 18 MG/3ML SOPN INJECT 1.8 MGS SUBCUTANEOUSLY EVERY DAY 9 pen 5  . testosterone cypionate (DEPOTESTOTERONE CYPIONATE) 200 MG/ML injection Inject 1 mL (200 mg total) into the muscle every 14 (fourteen) days. (Patient not taking: Reported on 12/05/2014) 10 mL 1   No current facility-administered medications on file prior to visit.   No Known Allergies Social History   Social History  . Marital Status: Married    Spouse Name: N/A  . Number of Children: N/A  . Years of Education: N/A   Occupational History  . Not on file.   Social History Main Topics  . Smoking status: Never Smoker   . Smokeless tobacco: Not on file  . Alcohol Use: Yes     Comment: Rare  . Drug Use: No  . Sexual Activity: Not on file   Other Topics Concern  . Not on file   Social History Narrative      Review of Systems  All other systems reviewed and are negative.      Objective:   Physical Exam  Cardiovascular: Normal rate, regular rhythm and normal heart sounds.   No murmur heard. Pulmonary/Chest: Effort normal  and breath sounds normal. No respiratory distress. He has no wheezes. He has no rales.  Abdominal: Soft. Bowel sounds are normal. He exhibits no distension. There is no tenderness. There is no rebound and no guarding.  Musculoskeletal: He exhibits no edema.  Vitals reviewed.         Assessment & Plan:  Hypogonadism in male - Plan: Testosterone (ANDROGEL) 40.5 MG/2.5GM (1.62%) GEL  Diabetes mellitus type II, uncontrolled - Plan: glipiZIDE (GLUCOTROL XL) 10 MG 24 hr tablet  Hypertriglyceridemia  Chronic fatigue patient seems to be doing better from the standpoint of his chronic fatigue after treatment of his hypogonadism. However due to the side effects he is experiencing, we will discontinue the  testosterone injections and switch the patient to androgel 1.62%, 1 prominent applied to the skin every day. Recheck in 3 months to see if this is working better for the patient. We had a long discussion today regarding his options for diabetes including resuming glipizide, adding Actos, or switching the patient to insulin. After a long discussion, the patient elects to resume glipizide extended release 10 mg by mouth daily. He also states he will try better to exercise more, lose weight, and monitor his diet. He would like to take the same approach as he is very recalcitrant taking any cholesterol medication as I have discussed in the past in his previous notes. Recheck in 3-6 months

## 2014-12-05 NOTE — Addendum Note (Signed)
Addended by: Shary Decamp B on: 12/05/2014 01:06 PM   Modules accepted: Orders

## 2014-12-08 ENCOUNTER — Telehealth: Payer: Self-pay | Admitting: Family Medicine

## 2014-12-08 NOTE — Telephone Encounter (Signed)
Patient called to let us know that his insurance will not cover the testosterone cream that was prescribed to him. The pharmacy faxed over another rx that the insurance company will cover. Pt would like to know if that rx will be called in or if he will have to come in to pick it up. Please call pt @ [phone] ext: [phone]

## 2014-12-08 NOTE — Telephone Encounter (Signed)
I have not seen anything in my box (I bet its up front).

## 2014-12-10 ENCOUNTER — Encounter: Payer: Self-pay | Admitting: Family Medicine

## 2014-12-11 ENCOUNTER — Telehealth: Payer: Self-pay | Admitting: Family Medicine

## 2014-12-11 MED ORDER — TESTOSTERONE 50 MG/5GM (1%) TD GEL: 5.0000 g | Freq: Every day | TRANSDERMAL | Status: DC

## 2014-12-11 NOTE — Telephone Encounter (Signed)
PA submitted through [email or web address]  

## 2014-12-11 NOTE — Telephone Encounter (Signed)
Insurance will cover Androderm and Testim.

## 2014-12-11 NOTE — Telephone Encounter (Signed)
Called and spoke to pt to inform that we have not seen anything yet - called pharm and talked to [place] and their computers are down she will call me back.

## 2014-12-11 NOTE — Telephone Encounter (Signed)
testim 50g applied to skin once a day.

## 2014-12-11 NOTE — Telephone Encounter (Signed)
Medication called/sent to requested pharmacy  

## 2014-12-19 ENCOUNTER — Encounter: Payer: Self-pay | Admitting: Family Medicine

## 2014-12-19 ENCOUNTER — Encounter: Payer: Self-pay | Admitting: *Deleted

## 2014-12-19 NOTE — Telephone Encounter (Signed)
Pt is aware via my-chart.

## 2014-12-19 NOTE — Telephone Encounter (Signed)
Received PA determination.   PA denied.   "Brand Testim can be approved if patient has two pre-treatment serum total testosterone levels less than 280 ng/dL"  Appeal letter faxed to [facility].

## 2014-12-31 NOTE — Telephone Encounter (Signed)
Approved through 12/19/15 - pt aware via mychart

## 2015-01-26 ENCOUNTER — Other Ambulatory Visit: Payer: Self-pay | Admitting: Family Medicine

## 2015-02-18 ENCOUNTER — Encounter: Payer: Self-pay | Admitting: Family Medicine

## 2015-02-18 MED ORDER — ACYCLOVIR 400 MG PO TABS: 400.0000 mg | ORAL_TABLET | Freq: Two times a day (BID) | ORAL | Status: DC

## 2015-02-27 ENCOUNTER — Other Ambulatory Visit: Payer: 59

## 2015-02-27 ENCOUNTER — Other Ambulatory Visit: Payer: Self-pay | Admitting: Family Medicine

## 2015-02-27 DIAGNOSIS — Z79890 Hormone replacement therapy (postmenopausal): Secondary | ICD-10-CM

## 2015-02-27 DIAGNOSIS — E291 Testicular hypofunction: Secondary | ICD-10-CM

## 2015-02-27 DIAGNOSIS — Z79899 Other long term (current) drug therapy: Secondary | ICD-10-CM

## 2015-02-28 LAB — TESTOSTERONE: TESTOSTERONE: 167 ng/dL — AB (ref 300–890)

## 2015-03-03 ENCOUNTER — Encounter: Payer: Self-pay | Admitting: Family Medicine

## 2015-03-03 ENCOUNTER — Ambulatory Visit (INDEPENDENT_AMBULATORY_CARE_PROVIDER_SITE_OTHER): Payer: 59 | Admitting: Family Medicine

## 2015-03-03 VITALS — BP 104/64 | HR 100 | Temp 97.7°F | Resp 20 | Ht 71.0 in | Wt 267.0 lb

## 2015-03-03 DIAGNOSIS — J019 Acute sinusitis, unspecified: Secondary | ICD-10-CM | POA: Diagnosis not present

## 2015-03-03 DIAGNOSIS — E119 Type 2 diabetes mellitus without complications: Secondary | ICD-10-CM

## 2015-03-03 MED ORDER — CEFDINIR 300 MG PO CAPS: 300.0000 mg | ORAL_CAPSULE | Freq: Two times a day (BID) | ORAL | Status: DC

## 2015-03-03 NOTE — Progress Notes (Signed)
Subjective:    Patient ID: Isaiah Beasley, male    DOB: 04/30/1965, 49 y.o.   MRN: [id number]  Headache    06/10/14 Patient is here today to follow-up his diabetes, his hyper triglyceridemia, his dyslipidemia, and his hypogonadism. patient's fatigue is much improved on testosterone. In that his testosterone level is still slightly low, the patient feels much better. He denies problems with his libido. He would like to continue the testosterone at the current dose. Unfortunately his Hemovac A1c has not improved area that is stable at 7.4 and his triglycerides are much better. Unfortunately his HDL cholesterol has fallen to 18. His LDL cholesterol is acceptable at close to 100.  At that time, my plan was: I had a long discussion with the patient. His blood pressure is excellent. He is satisfied with the current dose of testosterone so we will make no changes. I offered the patient Actos 30 mg a day to help treat his diabetes. He would like to try to lose 15 pounds over the next 6 months and then recheck his A1c at that time first. If his A1c is still elevated in 6 months we will start Actos. I have also recommended starting niacin 500 mg 3 times a day with meals to help address his low HDL. I'm very happy with the decline in his triglycerides. I've also recommended increasing aerobic exercise and monitoring his diet and consuming a low carbohydrate/low saturated fat diet..  12/05/14 Hemoglobin A1c has risen from 7.4 8.0. Patient admits that he has not been exercising or monitoring his diet. He also states that his fatigue has improved dramatically since starting testosterone injections. He really sees benefit from this. However the patient states that he feels good for 1 week after the injection and in the second week he feels extremely tired and moody. He is interested in trying to daily cream in order to maintain a more consistent testosterone level within his body. Triglycerides have risen slightly  since his last office visit. However the patient attributes that to dietary indiscretions he has been making over the summer. He states that he is been extremely busy at work and he has not been adhering to a low-fat low-carb diet like he should.  At that time, my plan was: patient seems to be doing better from the standpoint of his chronic fatigue after treatment of his hypogonadism. However due to the side effects he is experiencing, we will discontinue the testosterone injections and switch the patient to androgel 1.62%, 1 prominent applied to the skin every day. Recheck in 3 months to see if this is working better for the patient. We had a long discussion today regarding his options for diabetes including resuming glipizide, adding Actos, or switching the patient to insulin. After a long discussion, the patient elects to resume glipizide extended release 10 mg by mouth daily. He also states he will try better to exercise more, lose weight, and monitor his diet. He would like to take the same approach as he is very recalcitrant taking any cholesterol medication as I have discussed in the past in his previous notes. Recheck in 3-6 months  03/03/15 Patient is here today for his regularly scheduled follow-up for his diabetes mellitus. However he voices some concerning symptoms, the patient developed an upper respiratory infection. Ever since he has had severe pain located above and behind his left eye. At times the I will become red and teary raising the question of possible cluster headaches. At other times  the actual bone and orbital become tender to touch raising the possibility of a sinus infection. The headaches are not improving after 6 weeks. He denies any rhinorrhea. He denies any dizziness. However he has had 2 episodes where he forgot directions while driving. He was concerned because he was driving a route that he is taken thousands of times before. He's experienced no other episodes of memory loss or  personality changes aside from those 2 instances while driving. He denies any blurry vision or double vision. He denies any balance issues. He denies any syncope. He denies any neurologic deficit. Blood pressure today is well controlled at 104/64. He denies any chest pain shortness of breath or dyspnea on exertion. Past Medical History  Diagnosis Date  . Diabetes mellitus without complication ([place])   . Hypertension   . Hyperlipidemia    No past surgical history on file. Current Outpatient Prescriptions on File Prior to Visit  Medication Sig Dispense Refill  . acyclovir (ZOVIRAX) 400 MG tablet Take 1 tablet (400 mg total) by mouth 2 (two) times daily. 60 tablet 1  . aspirin 81 MG tablet Take 81 mg by mouth daily.    Marland Kitchen glipiZIDE (GLUCOTROL XL) 10 MG 24 hr tablet Take 1 tablet (10 mg total) by mouth daily with breakfast. 30 tablet 11  . JARDIANCE 25 MG TABS tablet TAKE 1 TABLET EVERY DAY 30 tablet 3  . metFORMIN (GLUCOPHAGE) 1000 MG tablet TAKE 1 TABLET TWICE A DAY WITH A MEAL 60 tablet 3  . Omega-3 Fatty Acids (FISH OIL) 1000 MG CAPS Take by mouth.    . testosterone (TESTIM) 50 MG/5GM (1%) GEL Place 5 g onto the skin daily. 1 Tube 5  . VICTOZA 18 MG/3ML SOPN INJECT 1.8 MGS SUBCUTANEOUSLY EVERY DAY 9 pen 5   No current facility-administered medications on file prior to visit.   No Known Allergies Social History   Social History  . Marital Status: Married    Spouse Name: N/A  . Number of Children: N/A  . Years of Education: N/A   Occupational History  . Not on file.   Social History Main Topics  . Smoking status: Never Smoker   . Smokeless tobacco: Not on file  . Alcohol Use: Yes     Comment: Rare  . Drug Use: No  . Sexual Activity: Not on file   Other Topics Concern  . Not on file   Social History Narrative      Review of Systems  Neurological: Positive for headaches.  All other systems reviewed and are negative.      Objective:   Physical Exam  Cardiovascular:  Normal rate, regular rhythm and normal heart sounds.   No murmur heard. Pulmonary/Chest: Effort normal and breath sounds normal. No respiratory distress. He has no wheezes. He has no rales.  Abdominal: Soft. Bowel sounds are normal. He exhibits no distension. There is no tenderness. There is no rebound and no guarding.  Musculoskeletal: He exhibits no edema.  Vitals reviewed.         Assessment & Plan:  Acute rhinosinusitis - Plan: cefdinir (OMNICEF) 300 MG capsule  Controlled type 2 diabetes mellitus without complication, without long-term current use of insulin (HCC) - Plan: Hemoglobin A1c, COMPLETE METABOLIC PANEL WITH GFR, Lipid panel  I will gladly check a hemoglobin A1c, CMP, and a fasting lipid panel to monitor for control of his type 2 diabetes and his dyslipidemia however I am very concerned about the headache he is having. Differential  diagnosis includes acute rhinosinusitis, neoplastic process in the orbit, cluster headache, acute angle-closure glaucoma.  Given the waxing and waning nature of his headache and eye pain I believe glaucoma is unlikely. I believe this is most likely a sinus infection and I will treat him with Omnicef 300 mg by mouth twice a day for 10 days. Recheck in one week. If not better, I will schedule the patient for a CT scan of the sinuses.

## 2015-03-04 LAB — LIPID PANEL
Cholesterol: 162 mg/dL (ref 125–200)
HDL: 18 mg/dL — ABNORMAL LOW (ref >=40)
LDL Cholesterol: 103 mg/dL (ref <130)
Total CHOL/HDL Ratio: 9 Ratio — ABNORMAL HIGH (ref <=5.0)
Triglycerides: 205 mg/dL — ABNORMAL HIGH (ref <150)
VLDL: 41 mg/dL — ABNORMAL HIGH (ref <30)

## 2015-03-04 LAB — HEMOGLOBIN A1C
Hgb A1c MFr Bld: 6.7 % — ABNORMAL HIGH (ref <5.7)
MEAN PLASMA GLUCOSE: 146 mg/dL — AB (ref <117)

## 2015-03-04 LAB — COMPREHENSIVE METABOLIC PANEL
ALK PHOS: 42 U/L (ref 40–115)
ALT: 27 U/L (ref 9–46)
AST: 22 U/L (ref 10–35)
Albumin: 3.8 g/dL (ref 3.6–5.1)
BILIRUBIN TOTAL: 0.4 mg/dL (ref 0.2–1.2)
BUN: 10 mg/dL (ref 7–25)
CALCIUM: 8.5 mg/dL — AB (ref 8.6–10.3)
CO2: 24 mmol/L (ref 20–31)
Chloride: 106 mmol/L (ref 98–110)
Creat: 0.62 mg/dL — ABNORMAL LOW (ref 0.70–1.33)
GLUCOSE: 133 mg/dL — AB (ref 70–99)
Potassium: 4.1 mmol/L (ref 3.5–5.3)
Sodium: 141 mmol/L (ref 135–146)
Total Protein: 5.9 g/dL — ABNORMAL LOW (ref 6.1–8.1)

## 2015-03-05 ENCOUNTER — Encounter: Payer: Self-pay | Admitting: Family Medicine

## 2015-03-11 ENCOUNTER — Other Ambulatory Visit: Payer: Self-pay | Admitting: Family Medicine

## 2015-03-11 MED ORDER — EMPAGLIFLOZIN 25 MG PO TABS: 25.0000 mg | ORAL_TABLET | Freq: Every day | ORAL | Status: DC

## 2015-04-02 ENCOUNTER — Ambulatory Visit (INDEPENDENT_AMBULATORY_CARE_PROVIDER_SITE_OTHER): Payer: 59 | Admitting: Family Medicine

## 2015-04-02 ENCOUNTER — Encounter: Payer: Self-pay | Admitting: Family Medicine

## 2015-04-02 VITALS — BP 128/84 | HR 87 | Temp 97.5°F | Resp 16 | Wt 266.0 lb

## 2015-04-02 DIAGNOSIS — I499 Cardiac arrhythmia, unspecified: Secondary | ICD-10-CM | POA: Diagnosis not present

## 2015-04-02 DIAGNOSIS — B009 Herpesviral infection, unspecified: Secondary | ICD-10-CM

## 2015-04-02 MED ORDER — VALACYCLOVIR HCL 500 MG PO TABS: 500.0000 mg | ORAL_TABLET | Freq: Two times a day (BID) | ORAL | Status: DC

## 2015-04-02 NOTE — Progress Notes (Signed)
Subjective:    Patient ID: Isaiah Beasley, male    DOB: 01-31-1966, 50 y.o.   MRN: [id number]  Headache    12/05/14 Hemoglobin A1c has risen from 7.4 8.0. Patient admits that he has not been exercising or monitoring his diet. He also states that his fatigue has improved dramatically since starting testosterone injections. He really sees benefit from this. However the patient states that he feels good for 1 week after the injection and in the second week he feels extremely tired and moody. He is interested in trying to daily cream in order to maintain a more consistent testosterone level within his body. Triglycerides have risen slightly since his last office visit. However the patient attributes that to dietary indiscretions he has been making over the summer. He states that he is been extremely busy at work and he has not been adhering to a low-fat low-carb diet like he should.  At that time, my plan was: patient seems to be doing better from the standpoint of his chronic fatigue after treatment of his hypogonadism. However due to the side effects he is experiencing, we will discontinue the testosterone injections and switch the patient to androgel 1.62%, 1 prominent applied to the skin every day. Recheck in 3 months to see if this is working better for the patient. We had a long discussion today regarding his options for diabetes including resuming glipizide, adding Actos, or switching the patient to insulin. After a long discussion, the patient elects to resume glipizide extended release 10 mg by mouth daily. He also states he will try better to exercise more, lose weight, and monitor his diet. He would like to take the same approach as he is very recalcitrant taking any cholesterol medication as I have discussed in the past in his previous notes. Recheck in 3-6 months  03/03/15 Patient is here today for his regularly scheduled follow-up for his diabetes mellitus. However he voices some concerning  symptoms, the patient developed an upper respiratory infection. Ever since he has had severe pain located above and behind his left eye. At times the I will become red and teary raising the question of possible cluster headaches. At other times the actual bone and orbital become tender to touch raising the possibility of a sinus infection. The headaches are not improving after 6 weeks. He denies any rhinorrhea. He denies any dizziness. However he has had 2 episodes where he forgot directions while driving. He was concerned because he was driving a route that he is taken thousands of times before. He's experienced no other episodes of memory loss or personality changes aside from those 2 instances while driving. He denies any blurry vision or double vision. He denies any balance issues. He denies any syncope. He denies any neurologic deficit. Blood pressure today is well controlled at 104/64. He denies any chest pain shortness of breath or dyspnea on exertion.  At that time, my plan was:  I will gladly check a hemoglobin A1c, CMP, and a fasting lipid panel to monitor for control of his type 2 diabetes and his dyslipidemia however I am very concerned about the headache he is having. Differential diagnosis includes acute rhinosinusitis, neoplastic process in the orbit, cluster headache, acute angle-closure glaucoma.  Given the waxing and waning nature of his headache and eye pain I believe glaucoma is unlikely. I believe this is most likely a sinus infection and I will treat him with Omnicef 300 mg by mouth twice a day for 10 days.  Recheck in one week. If not better, I will schedule the patient for a CT scan of the sinuses.  04/02/15 Symptoms gradually improved on abx.  Headaches have completely resolved.  No further memory issues.  Has a long-standing history of genital herpes. He takes acyclovir daily as a preventative. Recently he has had more frequent and longer lasting outbreaks. He is interested in switching to  Valtrex which his wife is used which seems to work better for her. Coincidentally on his exam today he has an irregularly irregular heartbeat. Patient is completely asymptomatic. He denies any chest pain shortness of breath syncope or near syncope Past Medical History  Diagnosis Date  . Diabetes mellitus without complication ([place])   . Hypertension   . Hyperlipidemia    No past surgical history on file. Current Outpatient Prescriptions on File Prior to Visit  Medication Sig Dispense Refill  . acyclovir (ZOVIRAX) 400 MG tablet Take 1 tablet (400 mg total) by mouth 2 (two) times daily. 60 tablet 1  . aspirin 81 MG tablet Take 81 mg by mouth daily.    . cefdinir (OMNICEF) 300 MG capsule Take 1 capsule (300 mg total) by mouth 2 (two) times daily. 20 capsule 0  . empagliflozin (JARDIANCE) 25 MG TABS tablet Take 25 mg by mouth daily. 30 tablet 3  . glipiZIDE (GLUCOTROL XL) 10 MG 24 hr tablet Take 1 tablet (10 mg total) by mouth daily with breakfast. 30 tablet 11  . metFORMIN (GLUCOPHAGE) 1000 MG tablet TAKE 1 TABLET TWICE A DAY WITH A MEAL 60 tablet 3  . Omega-3 Fatty Acids (FISH OIL) 1000 MG CAPS Take by mouth.    . testosterone (TESTIM) 50 MG/5GM (1%) GEL Place 5 g onto the skin daily. 1 Tube 5  . VICTOZA 18 MG/3ML SOPN INJECT 1.8 MGS SUBCUTANEOUSLY EVERY DAY 9 pen 5   No current facility-administered medications on file prior to visit.   No Known Allergies Social History   Social History  . Marital Status: Married    Spouse Name: N/A  . Number of Children: N/A  . Years of Education: N/A   Occupational History  . Not on file.   Social History Main Topics  . Smoking status: Never Smoker   . Smokeless tobacco: Not on file  . Alcohol Use: Yes     Comment: Rare  . Drug Use: No  . Sexual Activity: Not on file   Other Topics Concern  . Not on file   Social History Narrative      Review of Systems  All other systems reviewed and are negative.      Objective:   Physical  Exam  Cardiovascular: Normal rate and normal heart sounds.  An irregularly irregular rhythm present.  No murmur heard. Pulmonary/Chest: Effort normal and breath sounds normal. No respiratory distress. He has no wheezes. He has no rales.  Abdominal: Soft. Bowel sounds are normal. He exhibits no distension. There is no tenderness. There is no rebound and no guarding.  Musculoskeletal: He exhibits no edema.  Vitals reviewed.         Assessment & Plan:  Irregular heart beats  HSV (herpes simplex virus) infection  Switch acyclovir to Valtrex 500 mg by mouth daily. During outbreaks he can take 1000 mg by mouth twice a day for 3 days. I will also obtain an EKG to evaluate irregular heartbeat that I am finding on his physical exam. EKG today confirms sinus rhythm with a frequent pause but no AV block  I can appreciate consistent with marked sinus arrhythmia. I will schedule the patient for 24 hour Holter monitor to evaluate further to ensure that there is no AV block.

## 2015-04-03 ENCOUNTER — Ambulatory Visit (INDEPENDENT_AMBULATORY_CARE_PROVIDER_SITE_OTHER): Payer: 59 | Admitting: Family Medicine

## 2015-04-03 DIAGNOSIS — I499 Cardiac arrhythmia, unspecified: Secondary | ICD-10-CM

## 2015-04-03 NOTE — Progress Notes (Signed)
Pt had Holter Monitor placed yesterday, returns today for removal.  Denies any chest symptoms during this time.  Did states yellow lead popped off a couple times and has recorded this on his log sheet.  Recording to lab for analysis

## 2015-04-07 ENCOUNTER — Encounter: Payer: Self-pay | Admitting: Family Medicine

## 2015-04-20 ENCOUNTER — Encounter: Payer: Self-pay | Admitting: Family Medicine

## 2015-05-18 ENCOUNTER — Other Ambulatory Visit: Payer: Self-pay | Admitting: Family Medicine

## 2015-05-18 NOTE — Telephone Encounter (Signed)
Medication refilled per protocol. 

## 2015-06-04 ENCOUNTER — Encounter: Payer: Self-pay | Admitting: Family Medicine

## 2015-06-04 ENCOUNTER — Ambulatory Visit (INDEPENDENT_AMBULATORY_CARE_PROVIDER_SITE_OTHER): Payer: 59 | Admitting: Family Medicine

## 2015-06-04 VITALS — BP 126/78 | HR 80 | Temp 97.4°F | Resp 18 | Ht 71.0 in | Wt 266.0 lb

## 2015-06-04 DIAGNOSIS — E119 Type 2 diabetes mellitus without complications: Secondary | ICD-10-CM

## 2015-06-04 DIAGNOSIS — E291 Testicular hypofunction: Secondary | ICD-10-CM

## 2015-06-04 DIAGNOSIS — E781 Pure hyperglyceridemia: Secondary | ICD-10-CM | POA: Diagnosis not present

## 2015-06-04 DIAGNOSIS — Z23 Encounter for immunization: Secondary | ICD-10-CM | POA: Diagnosis not present

## 2015-06-04 LAB — COMPLETE METABOLIC PANEL WITH GFR
ALT: 19 U/L (ref 9–46)
AST: 14 U/L (ref 10–35)
Albumin: 4.2 g/dL (ref 3.6–5.1)
Alkaline Phosphatase: 50 U/L (ref 40–115)
BUN: 14 mg/dL (ref 7–25)
CALCIUM: 9.4 mg/dL (ref 8.6–10.3)
CHLORIDE: 104 mmol/L (ref 98–110)
CO2: 22 mmol/L (ref 20–31)
Creat: 0.6 mg/dL — ABNORMAL LOW (ref 0.70–1.33)
GFR, Est Non African American: >89 mL/min (ref >=60)
Glucose, Bld: 136 mg/dL — ABNORMAL HIGH (ref 70–99)
POTASSIUM: 4.4 mmol/L (ref 3.5–5.3)
Sodium: 139 mmol/L (ref 135–146)
Total Bilirubin: 0.5 mg/dL (ref 0.2–1.2)
Total Protein: 6.5 g/dL (ref 6.1–8.1)

## 2015-06-04 LAB — LIPID PANEL
CHOL/HDL RATIO: 9.4 ratio — AB (ref <=5.0)
CHOLESTEROL: 206 mg/dL — AB (ref 125–200)
HDL: 22 mg/dL — AB (ref >=40)
LDL CALC: 118 mg/dL (ref <130)
TRIGLYCERIDES: 329 mg/dL — AB (ref <150)
VLDL: 66 mg/dL — AB (ref <30)

## 2015-06-04 LAB — TESTOSTERONE: Testosterone: 170 ng/dL — ABNORMAL LOW (ref 250–827)

## 2015-06-04 NOTE — Addendum Note (Signed)
Addended by: Shary Decamp B on: 06/04/2015 11:45 AM   Modules accepted: Orders

## 2015-06-04 NOTE — Progress Notes (Signed)
Subjective:    Patient ID: Isaiah Beasley, male    DOB: 09/02/1965, 50 y.o.   MRN: [id number]  Headache    12/05/14 Hemoglobin A1c has risen from 7.4 8.0. Patient admits that he has not been exercising or monitoring his diet. He also states that his fatigue has improved dramatically since starting testosterone injections. He really sees benefit from this. However the patient states that he feels good for 1 week after the injection and in the second week he feels extremely tired and moody. He is interested in trying to daily cream in order to maintain a more consistent testosterone level within his body. Triglycerides have risen slightly since his last office visit. However the patient attributes that to dietary indiscretions he has been making over the summer. He states that he is been extremely busy at work and he has not been adhering to a low-fat low-carb diet like he should.  At that time, my plan was: patient seems to be doing better from the standpoint of his chronic fatigue after treatment of his hypogonadism. However due to the side effects he is experiencing, we will discontinue the testosterone injections and switch the patient to androgel 1.62%, 1 prominent applied to the skin every day. Recheck in 3 months to see if this is working better for the patient. We had a long discussion today regarding his options for diabetes including resuming glipizide, adding Actos, or switching the patient to insulin. After a long discussion, the patient elects to resume glipizide extended release 10 mg by mouth daily. He also states he will try better to exercise more, lose weight, and monitor his diet. He would like to take the same approach as he is very recalcitrant taking any cholesterol medication as I have discussed in the past in his previous notes. Recheck in 3-6 months  03/03/15 Patient is here today for his regularly scheduled follow-up for his diabetes mellitus. However he voices some concerning  symptoms, the patient developed an upper respiratory infection. Ever since he has had severe pain located above and behind his left eye. At times the I will become red and teary raising the question of possible cluster headaches. At other times the actual bone and orbital become tender to touch raising the possibility of a sinus infection. The headaches are not improving after 6 weeks. He denies any rhinorrhea. He denies any dizziness. However he has had 2 episodes where he forgot directions while driving. He was concerned because he was driving a route that he is taken thousands of times before. He's experienced no other episodes of memory loss or personality changes aside from those 2 instances while driving. He denies any blurry vision or double vision. He denies any balance issues. He denies any syncope. He denies any neurologic deficit. Blood pressure today is well controlled at 104/64. He denies any chest pain shortness of breath or dyspnea on exertion.  At that time, my plan was:  I will gladly check a hemoglobin A1c, CMP, and a fasting lipid panel to monitor for control of his type 2 diabetes and his dyslipidemia however I am very concerned about the headache he is having. Differential diagnosis includes acute rhinosinusitis, neoplastic process in the orbit, cluster headache, acute angle-closure glaucoma.  Given the waxing and waning nature of his headache and eye pain I believe glaucoma is unlikely. I believe this is most likely a sinus infection and I will treat him with Omnicef 300 mg by mouth twice a day for 10 days.  Recheck in one week. If not better, I will schedule the patient for a CT scan of the sinuses.  04/02/15 Symptoms gradually improved on abx.  Headaches have completely resolved.  No further memory issues.  Has a long-standing history of genital herpes. He takes acyclovir daily as a preventative. Recently he has had more frequent and longer lasting outbreaks. He is interested in switching to  Valtrex which his wife is used which seems to work better for her. Coincidentally on his exam today he has an irregularly irregular heartbeat. Patient is completely asymptomatic. He denies any chest pain shortness of breath syncope or near syncope.  At that time, my plan was: Switch acyclovir to Valtrex 500 mg by mouth daily. During outbreaks he can take 1000 mg by mouth twice a day for 3 days. I will also obtain an EKG to evaluate irregular heartbeat that I am finding on his physical exam. EKG today confirms sinus rhythm with a frequent pause but no AV block I can appreciate consistent with marked sinus arrhythmia. I will schedule the patient for 24 hour Holter monitor to evaluate further to ensure that there is no AV block.  06/04/15 Thankfully his had no further headaches. He did recently have a root canal on the left side. He is overdue for PSA. He is also due for colonoscopy but he does not want to have a colonoscopy. He is due for Pneumovax 23. Diabetic foot exam is performed today is completely normal. Diabetic eye exam was performed in January and was normal. He denies any chest pain shortness of breath or dyspnea on exertion. He denies any myalgias or right upper quadrant pain. He denies any peripheral neuropathy. He denies any polyuria polydipsia or blurry vision. He does still occasionally have an irregular heartbeat but he is asymptomatic. Past Medical History  Diagnosis Date  . Diabetes mellitus without complication ([place])   . Hypertension   . Hyperlipidemia   . Wandering (atrial) pacemaker    No past surgical history on file. Current Outpatient Prescriptions on File Prior to Visit  Medication Sig Dispense Refill  . acyclovir (ZOVIRAX) 400 MG tablet Take 1 tablet (400 mg total) by mouth 2 (two) times daily. 60 tablet 1  . aspirin 81 MG tablet Take 81 mg by mouth daily.    . empagliflozin (JARDIANCE) 25 MG TABS tablet Take 25 mg by mouth daily. 30 tablet 3  . glipiZIDE (GLUCOTROL XL) 10 MG  24 hr tablet Take 1 tablet (10 mg total) by mouth daily with breakfast. 30 tablet 11  . metFORMIN (GLUCOPHAGE) 1000 MG tablet TAKE 1 TABLET TWICE A DAY WITH A MEAL 60 tablet 3  . Omega-3 Fatty Acids (FISH OIL) 1000 MG CAPS Take by mouth.    . testosterone (TESTIM) 50 MG/5GM (1%) GEL Place 5 g onto the skin daily. 1 Tube 5  . valACYclovir (VALTREX) 500 MG tablet Take 1 tablet (500 mg total) by mouth 2 (two) times daily. 30 tablet 11  . VICTOZA 18 MG/3ML SOPN INJECT 1.8 MGS SUBCUTANEOUSLY EVERY DAY 9 pen 5   No current facility-administered medications on file prior to visit.   No Known Allergies Social History   Social History  . Marital Status: Married    Spouse Name: N/A  . Number of Children: N/A  . Years of Education: N/A   Occupational History  . Not on file.   Social History Main Topics  . Smoking status: Never Smoker   . Smokeless tobacco: Not on file  .  Alcohol Use: Yes     Comment: Rare  . Drug Use: No  . Sexual Activity: Not on file   Other Topics Concern  . Not on file   Social History Narrative      Review of Systems  Neurological: Positive for headaches.  All other systems reviewed and are negative.      Objective:   Physical Exam  Cardiovascular: Normal rate and normal heart sounds.  An irregularly irregular rhythm present.  No murmur heard. Pulmonary/Chest: Effort normal and breath sounds normal. No respiratory distress. He has no wheezes. He has no rales.  Abdominal: Soft. Bowel sounds are normal. He exhibits no distension. There is no tenderness. There is no rebound and no guarding.  Musculoskeletal: He exhibits no edema.  Vitals reviewed.         Assessment & Plan:  Controlled type 2 diabetes mellitus without complication, without long-term current use of insulin (HCC) - Plan: COMPLETE METABOLIC PANEL WITH GFR, Lipid panel, Microalbumin, urine, Hemoglobin A1c  Hypogonadism in male - Plan: PSA, Testosterone  Hypertriglyceridemia  His  blood pressure today is outstanding I will make no changes in his medication at this time. I will check a hemoglobin A1c. Goal hemoglobin A1c is less than 6.5. I recommended a colonoscopy but he politely declined. We did discuss cologuard.  He also declined hepatitis C and HIV screening. I will check a PSA as well as a testosterone along with fasting lipid panel. Diabetic eye exam and diabetic foot exam are up-to-date.

## 2015-06-05 LAB — PSA: PSA: 0.27 ng/mL (ref <=4.00)

## 2015-06-05 LAB — MICROALBUMIN, URINE: Microalb, Ur: 1.7 mg/dL

## 2015-06-05 LAB — HEMOGLOBIN A1C
Hgb A1c MFr Bld: 7.7 % — ABNORMAL HIGH (ref <5.7)
Mean Plasma Glucose: 174 mg/dL — ABNORMAL HIGH (ref <117)

## 2015-06-08 ENCOUNTER — Encounter: Payer: Self-pay | Admitting: Family Medicine

## 2015-07-02 ENCOUNTER — Other Ambulatory Visit: Payer: Self-pay | Admitting: Family Medicine

## 2015-07-02 NOTE — Telephone Encounter (Signed)
Medication called to pharmacy. 

## 2015-07-02 NOTE — Telephone Encounter (Signed)
ok 

## 2015-07-02 NOTE — Telephone Encounter (Signed)
Ok to refill??  Last office visit 06/04/2015.  Last refill 12/11/2014, #5 refills.

## 2015-07-31 ENCOUNTER — Other Ambulatory Visit: Payer: Self-pay | Admitting: Family Medicine

## 2015-10-02 ENCOUNTER — Other Ambulatory Visit: Payer: Self-pay | Admitting: Family Medicine

## 2015-12-07 ENCOUNTER — Other Ambulatory Visit: Payer: 59

## 2015-12-07 ENCOUNTER — Encounter: Payer: Self-pay | Admitting: Family Medicine

## 2015-12-07 DIAGNOSIS — R7989 Other specified abnormal findings of blood chemistry: Secondary | ICD-10-CM

## 2015-12-07 DIAGNOSIS — Z79899 Other long term (current) drug therapy: Secondary | ICD-10-CM

## 2015-12-07 DIAGNOSIS — E119 Type 2 diabetes mellitus without complications: Secondary | ICD-10-CM | POA: Insufficient documentation

## 2015-12-07 DIAGNOSIS — E781 Pure hyperglyceridemia: Secondary | ICD-10-CM | POA: Insufficient documentation

## 2015-12-07 DIAGNOSIS — E118 Type 2 diabetes mellitus with unspecified complications: Secondary | ICD-10-CM | POA: Insufficient documentation

## 2015-12-07 LAB — COMPLETE METABOLIC PANEL WITH GFR
ALBUMIN: 4.2 g/dL (ref 3.6–5.1)
ALT: 24 U/L (ref 9–46)
AST: 16 U/L (ref 10–35)
Alkaline Phosphatase: 52 U/L (ref 40–115)
BILIRUBIN TOTAL: 0.4 mg/dL (ref 0.2–1.2)
BUN: 11 mg/dL (ref 7–25)
CALCIUM: 9.2 mg/dL (ref 8.6–10.3)
CHLORIDE: 105 mmol/L (ref 98–110)
CO2: 22 mmol/L (ref 20–31)
CREATININE: 0.53 mg/dL — AB (ref 0.70–1.33)
GFR, Est Non African American: >89 mL/min (ref >=60)
Glucose, Bld: 225 mg/dL — ABNORMAL HIGH (ref 70–99)
Potassium: 4.2 mmol/L (ref 3.5–5.3)
Sodium: 140 mmol/L (ref 135–146)
TOTAL PROTEIN: 6.3 g/dL (ref 6.1–8.1)

## 2015-12-07 LAB — CBC WITH DIFFERENTIAL/PLATELET
BASOS ABS: 67 {cells}/uL (ref 0–200)
Basophils Relative: 1 %
EOS ABS: 67 {cells}/uL (ref 15–500)
EOS PCT: 1 %
HCT: 48.1 % (ref 38.5–50.0)
HEMOGLOBIN: 16.2 g/dL (ref 13.0–17.0)
LYMPHS PCT: 35 %
Lymphs Abs: 2345 cells/uL (ref 850–3900)
MCH: 30.9 pg (ref 27.0–33.0)
MCHC: 33.7 g/dL (ref 32.0–36.0)
MCV: 91.8 fL (ref 80.0–100.0)
MONOS PCT: 8 %
MPV: 10.3 fL (ref 7.5–12.5)
Monocytes Absolute: 536 cells/uL (ref 200–950)
Neutro Abs: 3685 cells/uL (ref 1500–7800)
Neutrophils Relative %: 55 %
PLATELETS: 202 10*3/uL (ref 140–400)
RBC: 5.24 MIL/uL (ref 4.20–5.80)
RDW: 13.3 % (ref 11.0–15.0)
WBC: 6.7 10*3/uL (ref 3.8–10.8)

## 2015-12-07 LAB — LIPID PANEL
CHOLESTEROL: 223 mg/dL — AB (ref 125–200)
HDL: 19 mg/dL — ABNORMAL LOW (ref >=40)
TRIGLYCERIDES: 611 mg/dL — AB (ref <150)
Total CHOL/HDL Ratio: 11.7 Ratio — ABNORMAL HIGH (ref <=5.0)

## 2015-12-08 ENCOUNTER — Other Ambulatory Visit: Payer: Self-pay | Admitting: Family Medicine

## 2015-12-08 LAB — HEMOGLOBIN A1C
HEMOGLOBIN A1C: 7.6 % — AB (ref <5.7)
Mean Plasma Glucose: 171 mg/dL

## 2015-12-08 LAB — TESTOSTERONE: TESTOSTERONE: 148 ng/dL — AB (ref 250–827)

## 2015-12-08 LAB — TSH: TSH: 1.61 m[IU]/L (ref 0.40–4.50)

## 2015-12-08 MED ORDER — LIRAGLUTIDE 18 MG/3ML ~~LOC~~ SOPN: PEN_INJECTOR | SUBCUTANEOUS | 5 refills | Status: DC

## 2015-12-11 ENCOUNTER — Telehealth: Payer: Self-pay | Admitting: *Deleted

## 2015-12-11 ENCOUNTER — Ambulatory Visit (INDEPENDENT_AMBULATORY_CARE_PROVIDER_SITE_OTHER): Payer: 59 | Admitting: Family Medicine

## 2015-12-11 ENCOUNTER — Encounter: Payer: Self-pay | Admitting: Family Medicine

## 2015-12-11 VITALS — BP 126/76 | HR 86 | Temp 98.4°F | Resp 18 | Ht 71.0 in | Wt 263.0 lb

## 2015-12-11 DIAGNOSIS — E781 Pure hyperglyceridemia: Secondary | ICD-10-CM | POA: Diagnosis not present

## 2015-12-11 DIAGNOSIS — E291 Testicular hypofunction: Secondary | ICD-10-CM

## 2015-12-11 DIAGNOSIS — Z23 Encounter for immunization: Secondary | ICD-10-CM | POA: Diagnosis not present

## 2015-12-11 DIAGNOSIS — E119 Type 2 diabetes mellitus without complications: Secondary | ICD-10-CM | POA: Diagnosis not present

## 2015-12-11 DIAGNOSIS — R7989 Other specified abnormal findings of blood chemistry: Secondary | ICD-10-CM

## 2015-12-11 MED ORDER — LORCASERIN HCL 10 MG PO TABS: 10.0000 mg | ORAL_TABLET | Freq: Two times a day (BID) | ORAL | 5 refills | Status: DC

## 2015-12-11 MED ORDER — FENOFIBRATE 145 MG PO TABS: 145.0000 mg | ORAL_TABLET | Freq: Every day | ORAL | 5 refills | Status: DC

## 2015-12-11 MED ORDER — FENOFIBRATE 160 MG PO TABS: 160.0000 mg | ORAL_TABLET | Freq: Every day | ORAL | 3 refills | Status: DC

## 2015-12-11 NOTE — Progress Notes (Signed)
Subjective:    Patient ID: Isaiah Beasley, male    DOB: Oct 20, 1965, 50 y.o.   MRN: [id number]  Headache     12/05/14 Hemoglobin A1c has risen from 7.4 8.0. Patient admits that he has not been exercising or monitoring his diet. He also states that his fatigue has improved dramatically since starting testosterone injections. He really sees benefit from this. However the patient states that he feels good for 1 week after the injection and in the second week he feels extremely tired and moody. He is interested in trying to daily cream in order to maintain a more consistent testosterone level within his body. Triglycerides have risen slightly since his last office visit. However the patient attributes that to dietary indiscretions he has been making over the summer. He states that he is been extremely busy at work and he has not been adhering to a low-fat low-carb diet like he should.  At that time, my plan was: patient seems to be doing better from the standpoint of his chronic fatigue after treatment of his hypogonadism. However due to the side effects he is experiencing, we will discontinue the testosterone injections and switch the patient to androgel 1.62%, 1 prominent applied to the skin every day. Recheck in 3 months to see if this is working better for the patient. We had a long discussion today regarding his options for diabetes including resuming glipizide, adding Actos, or switching the patient to insulin. After a long discussion, the patient elects to resume glipizide extended release 10 mg by mouth daily. He also states he will try better to exercise more, lose weight, and monitor his diet. He would like to take the same approach as he is very recalcitrant taking any cholesterol medication as I have discussed in the past in his previous notes. Recheck in 3-6 months  03/03/15 Patient is here today for his regularly scheduled follow-up for his diabetes mellitus. However he voices some concerning  symptoms, the patient developed an upper respiratory infection. Ever since he has had severe pain located above and behind his left eye. At times the I will become red and teary raising the question of possible cluster headaches. At other times the actual bone and orbital become tender to touch raising the possibility of a sinus infection. The headaches are not improving after 6 weeks. He denies any rhinorrhea. He denies any dizziness. However he has had 2 episodes where he forgot directions while driving. He was concerned because he was driving a route that he is taken thousands of times before. He's experienced no other episodes of memory loss or personality changes aside from those 2 instances while driving. He denies any blurry vision or double vision. He denies any balance issues. He denies any syncope. He denies any neurologic deficit. Blood pressure today is well controlled at 104/64. He denies any chest pain shortness of breath or dyspnea on exertion.  At that time, my plan was:  I will gladly check a hemoglobin A1c, CMP, and a fasting lipid panel to monitor for control of his type 2 diabetes and his dyslipidemia however I am very concerned about the headache he is having. Differential diagnosis includes acute rhinosinusitis, neoplastic process in the orbit, cluster headache, acute angle-closure glaucoma.  Given the waxing and waning nature of his headache and eye pain I believe glaucoma is unlikely. I believe this is most likely a sinus infection and I will treat him with Omnicef 300 mg by mouth twice a day for 10  days. Recheck in one week. If not better, I will schedule the patient for a CT scan of the sinuses.  04/02/15 Symptoms gradually improved on abx.  Headaches have completely resolved.  No further memory issues.  Has a long-standing history of genital herpes. He takes acyclovir daily as a preventative. Recently he has had more frequent and longer lasting outbreaks. He is interested in switching to  Valtrex which his wife is used which seems to work better for her. Coincidentally on his exam today he has an irregularly irregular heartbeat. Patient is completely asymptomatic. He denies any chest pain shortness of breath syncope or near syncope.  At that time, my plan was: Switch acyclovir to Valtrex 500 mg by mouth daily. During outbreaks he can take 1000 mg by mouth twice a day for 3 days. I will also obtain an EKG to evaluate irregular heartbeat that I am finding on his physical exam. EKG today confirms sinus rhythm with a frequent pause but no AV block I can appreciate consistent with marked sinus arrhythmia. I will schedule the patient for 24 hour Holter monitor to evaluate further to ensure that there is no AV block.  06/04/15 Thankfully his had no further headaches. He did recently have a root canal on the left side. He is overdue for PSA. He is also due for colonoscopy but he does not want to have a colonoscopy. He is due for Pneumovax 23. Diabetic foot exam is performed today is completely normal. Diabetic eye exam was performed in January and was normal. He denies any chest pain shortness of breath or dyspnea on exertion. He denies any myalgias or right upper quadrant pain. He denies any peripheral neuropathy. He denies any polyuria polydipsia or blurry vision. He does still occasionally have an irregular heartbeat but he is asymptomatic.  At that time, my plan was: His blood pressure today is outstanding I will make no changes in his medication at this time. I will check a hemoglobin A1c. Goal hemoglobin A1c is less than 6.5. I recommended a colonoscopy but he politely declined. We did discuss cologuard.  He also declined hepatitis C and HIV screening. I will check a PSA as well as a testosterone along with fasting lipid panel. Diabetic eye exam and diabetic foot exam are up-to-date.  12/11/15 Lab on 12/07/2015  Component Date Value Ref Range Status  . Testosterone 12/08/2015 148* 250 - 827  ng/dL Final   Comment: In hypogonadal males, Testosterone, Total,LC/MS/MS is the recommended assay due to the diminished accuracy of immunoassay at levels below 250 ng/dL.  This test code [id number]) must be collected in a red-top tube with no gel.  Two morning (8 - 10 AM) specimens obtained on different days are recommended by The [employer] Society for screening for hypogonadism. Men with clinically significant hypogonadal symptoms and testosterone values repeatedly less than approximately 300 ng/dL may benefit from testosterone treatment after adequate risk and benefits counseling.   . Hgb A1c MFr Bld 12/08/2015 7.6* <5.7 % Final   Comment:   For someone without known diabetes, a hemoglobin A1c value of 6.5% or greater indicates that they may have diabetes and this should be confirmed with a follow-up test.   For someone with known diabetes, a value <7% indicates that their diabetes is well controlled and a value greater than or equal to 7% indicates suboptimal control. A1c targets should be individualized based on duration of diabetes, age, comorbid conditions, and other considerations.   Currently, no consensus exists for use  of hemoglobin A1c for diagnosis of diabetes for children.     . Mean Plasma Glucose 12/08/2015 171  mg/dL Final  . WBC 12/07/2015 6.7  3.8 - 10.8 K/uL Final  . RBC 12/07/2015 5.24  4.20 - 5.80 MIL/uL Final  . Hemoglobin 12/07/2015 16.2  13.0 - 17.0 g/dL Final  . HCT 12/07/2015 48.1  38.5 - 50.0 % Final  . MCV 12/07/2015 91.8  80.0 - 100.0 fL Final  . MCH 12/07/2015 30.9  27.0 - 33.0 pg Final  . MCHC 12/07/2015 33.7  32.0 - 36.0 g/dL Final  . RDW 12/07/2015 13.3  11.0 - 15.0 % Final  . Platelets 12/07/2015 202  140 - 400 K/uL Final  . MPV 12/07/2015 10.3  7.5 - 12.5 fL Final  . Neutro Abs 12/07/2015 3685  1,500 - 7,800 cells/uL Final  . Lymphs Abs 12/07/2015 2345  850 - 3,900 cells/uL Final  . Monocytes Absolute 12/07/2015 536  200 - 950 cells/uL Final  .  Eosinophils Absolute 12/07/2015 67  15 - 500 cells/uL Final  . Basophils Absolute 12/07/2015 67  0 - 200 cells/uL Final  . Neutrophils Relative % 12/07/2015 55  % Final  . Lymphocytes Relative 12/07/2015 35  % Final  . Monocytes Relative 12/07/2015 8  % Final  . Eosinophils Relative 12/07/2015 1  % Final  . Basophils Relative 12/07/2015 1  % Final  . Smear Review 12/07/2015 Criteria for review not met   Final  . Cholesterol 12/07/2015 223* 125 - 200 mg/dL Final  . Triglycerides 12/07/2015 611* <150 mg/dL Final  . HDL 12/07/2015 19* >=40 mg/dL Final  . Total CHOL/HDL Ratio 12/07/2015 11.7* <=5.0 Ratio Final  . VLDL 12/07/2015 NOT CALC  <30 mg/dL Final   Comment:   Not calculated due to Triglyceride >400. Suggest ordering Direct LDL (Unit Code: [phone]).   . LDL Cholesterol 12/07/2015 NOT CALC  <130 mg/dL Final   Comment:   Not calculated due to Triglyceride >400. Suggest ordering Direct LDL (Unit Code: [id number]).   Total Cholesterol/HDL Ratio:CHD Risk                        Coronary Heart Disease Risk Table                                        Men       Women          1/2 Average Risk              3.4        3.3              Average Risk              5.0        4.4           2X Average Risk              9.6        7.1           3X Average Risk             23.4       11.0 Use the calculated Patient Ratio above and the CHD Risk table  to determine the patient's CHD Risk.   . Sodium 12/07/2015 140  135 - 146 mmol/L Final  . Potassium 12/07/2015  4.2  3.5 - 5.3 mmol/L Final  . Chloride 12/07/2015 105  98 - 110 mmol/L Final  . CO2 12/07/2015 22  20 - 31 mmol/L Final  . Glucose, Bld 12/07/2015 225* 70 - 99 mg/dL Final  . BUN 12/07/2015 11  7 - 25 mg/dL Final  . Creat 12/07/2015 0.53* 0.70 - 1.33 mg/dL Final   Comment:   For patients > or = 52 years of age: The upper reference limit for Creatinine is approximately 13% higher for people identified as African-American.     . Total  Bilirubin 12/07/2015 0.4  0.2 - 1.2 mg/dL Final  . Alkaline Phosphatase 12/07/2015 52  40 - 115 U/L Final  . AST 12/07/2015 16  10 - 35 U/L Final  . ALT 12/07/2015 24  9 - 46 U/L Final  . Total Protein 12/07/2015 6.3  6.1 - 8.1 g/dL Final  . Albumin 12/07/2015 4.2  3.6 - 5.1 g/dL Final  . Calcium 12/07/2015 9.2  8.6 - 10.3 mg/dL Final  . GFR, Est African American 12/07/2015 >89  >=60 mL/min Final  . GFR, Est Non African American 12/07/2015 >89  >=60 mL/min Final  . TSH 12/08/2015 1.61  0.40 - 4.50 mIU/L Final   Has no interest in starting actos.  Interested in trying belviq for weight loss.  Has been very hesitant in the past to try any cholesterol medication due to his family history of statin intolerance. However his triglyceride levels are becoming so high he is willing to reconsider due to the risk of pancreatitis and heart disease. Past Medical History:  Diagnosis Date  . Diabetes mellitus without complication ([place])   . Hyperlipidemia   . Hypertension   . Wandering (atrial) pacemaker    No past surgical history on file. Current Outpatient Prescriptions on File Prior to Visit  Medication Sig Dispense Refill  . aspirin 81 MG tablet Take 81 mg by mouth daily.    Marland Kitchen glipiZIDE (GLUCOTROL XL) 10 MG 24 hr tablet Take 1 tablet (10 mg total) by mouth daily with breakfast. 30 tablet 11  . JARDIANCE 25 MG TABS tablet TAKE 1 TABLET BY MOUTH EVERY DAY 30 tablet 3  . Liraglutide (VICTOZA) 18 MG/3ML SOPN INJECT 1.8 MGS SUBCUTANEOUSLY EVERY DAY 9 pen 5  . metFORMIN (GLUCOPHAGE) 1000 MG tablet TAKE 1 TABLET TWICE A DAY WITH A MEAL 60 tablet 3  . Omega-3 Fatty Acids (FISH OIL) 1000 MG CAPS Take by mouth.    . TESTIM 50 MG/5GM (1%) GEL APPLY 5 GMS TO SKIN DAILY 150 g 5  . valACYclovir (VALTREX) 500 MG tablet Take 1 tablet (500 mg total) by mouth 2 (two) times daily. 30 tablet 11   No current facility-administered medications on file prior to visit.    No Known Allergies Social History   Social  History  . Marital status: Married    Spouse name: N/A  . Number of children: N/A  . Years of education: N/A   Occupational History  . Not on file.   Social History Main Topics  . Smoking status: Never Smoker  . Smokeless tobacco: Not on file  . Alcohol use Yes     Comment: Rare  . Drug use: No  . Sexual activity: Not on file   Other Topics Concern  . Not on file   Social History Narrative  . No narrative on file      Review of Systems  All other systems reviewed and are negative.  Objective:   Physical Exam  Constitutional: He is oriented to person, place, and time. He appears well-developed and well-nourished.  Neck: Neck supple. No JVD present. No thyromegaly present.  Cardiovascular: Normal rate, regular rhythm and normal heart sounds.   No murmur heard. Pulmonary/Chest: Effort normal and breath sounds normal. No respiratory distress. He has no wheezes. He has no rales.  Abdominal: Soft. Bowel sounds are normal. He exhibits no distension. There is no tenderness. There is no rebound and no guarding.  Musculoskeletal: He exhibits no edema.  Lymphadenopathy:    He has no cervical adenopathy.  Neurological: He is alert and oriented to person, place, and time. He has normal reflexes. He displays normal reflexes. No cranial nerve deficit. He exhibits normal muscle tone. Coordination normal.  Vitals reviewed.         Assessment & Plan:  Diabetes mellitus without complication ([place]) - Plan: Lorcaserin HCl (BELVIQ) 10 MG TABS  Low testosterone  Hypertriglyceridemia  Blood pressure is excellent. I would suggest trying Actos in addition to his other medication however he would like to try weight loss using belviq 10 mg pobid.Marland Kitchen  Hopefully with 10-20 pounds weight loss we will see his hemoglobin A1c fall to below 6.5. Recheck in 3 months. Begin fenofibrate 145 mg by mouth daily for hypertriglyceridemia/dyslipidemia/low HDL. Recheck cholesterol in 3 months. Monitor  for muscle aches. Patient received his flu shot today.

## 2015-12-11 NOTE — Telephone Encounter (Signed)
Received fax from pharmacy.  Reports that Fenofibrate 145mg  is not covered by patient insurance, but Fenofibrate 160mg  is tier 2.   Prescription changed to Fenofibrate 160mg  PO QD.   Prescription sent to pharmacy.

## 2015-12-11 NOTE — Addendum Note (Signed)
Addended by: Shary Decamp B on: 12/11/2015 08:49 AM   Modules accepted: Orders

## 2016-01-17 ENCOUNTER — Encounter: Payer: Self-pay | Admitting: Family Medicine

## 2016-01-18 MED ORDER — EMPAGLIFLOZIN 25 MG PO TABS: 25.0000 mg | ORAL_TABLET | Freq: Every day | ORAL | 1 refills | Status: DC

## 2016-01-18 NOTE — Telephone Encounter (Signed)
Medication called/sent to requested pharmacy  

## 2016-02-10 ENCOUNTER — Encounter: Payer: Self-pay | Admitting: Family Medicine

## 2016-02-11 MED ORDER — LIRAGLUTIDE 18 MG/3ML ~~LOC~~ SOPN: PEN_INJECTOR | SUBCUTANEOUS | 5 refills | Status: DC

## 2016-02-11 MED ORDER — METFORMIN HCL 1000 MG PO TABS: ORAL_TABLET | ORAL | 5 refills | Status: DC

## 2016-04-19 ENCOUNTER — Other Ambulatory Visit: Payer: Self-pay | Admitting: Family Medicine

## 2016-04-20 NOTE — Telephone Encounter (Signed)
Medication refilled per protocol. 

## 2016-04-22 ENCOUNTER — Other Ambulatory Visit: Payer: Self-pay | Admitting: Family Medicine

## 2016-07-07 ENCOUNTER — Other Ambulatory Visit: Payer: Self-pay | Admitting: Family Medicine

## 2016-07-08 DIAGNOSIS — M9903 Segmental and somatic dysfunction of lumbar region: Secondary | ICD-10-CM | POA: Diagnosis not present

## 2016-07-08 DIAGNOSIS — M545 Low back pain: Secondary | ICD-10-CM | POA: Diagnosis not present

## 2016-07-08 DIAGNOSIS — M9905 Segmental and somatic dysfunction of pelvic region: Secondary | ICD-10-CM | POA: Diagnosis not present

## 2016-07-11 DIAGNOSIS — M545 Low back pain: Secondary | ICD-10-CM | POA: Diagnosis not present

## 2016-07-11 DIAGNOSIS — M9903 Segmental and somatic dysfunction of lumbar region: Secondary | ICD-10-CM | POA: Diagnosis not present

## 2016-07-11 DIAGNOSIS — M9905 Segmental and somatic dysfunction of pelvic region: Secondary | ICD-10-CM | POA: Diagnosis not present

## 2016-07-13 DIAGNOSIS — M9903 Segmental and somatic dysfunction of lumbar region: Secondary | ICD-10-CM | POA: Diagnosis not present

## 2016-07-13 DIAGNOSIS — M545 Low back pain: Secondary | ICD-10-CM | POA: Diagnosis not present

## 2016-07-13 DIAGNOSIS — M9905 Segmental and somatic dysfunction of pelvic region: Secondary | ICD-10-CM | POA: Diagnosis not present

## 2016-07-19 DIAGNOSIS — M9903 Segmental and somatic dysfunction of lumbar region: Secondary | ICD-10-CM | POA: Diagnosis not present

## 2016-07-19 DIAGNOSIS — M9905 Segmental and somatic dysfunction of pelvic region: Secondary | ICD-10-CM | POA: Diagnosis not present

## 2016-07-19 DIAGNOSIS — M545 Low back pain: Secondary | ICD-10-CM | POA: Diagnosis not present

## 2016-07-22 DIAGNOSIS — M9905 Segmental and somatic dysfunction of pelvic region: Secondary | ICD-10-CM | POA: Diagnosis not present

## 2016-07-22 DIAGNOSIS — M9903 Segmental and somatic dysfunction of lumbar region: Secondary | ICD-10-CM | POA: Diagnosis not present

## 2016-07-22 DIAGNOSIS — M545 Low back pain: Secondary | ICD-10-CM | POA: Diagnosis not present

## 2016-07-27 DIAGNOSIS — M9905 Segmental and somatic dysfunction of pelvic region: Secondary | ICD-10-CM | POA: Diagnosis not present

## 2016-07-27 DIAGNOSIS — M545 Low back pain: Secondary | ICD-10-CM | POA: Diagnosis not present

## 2016-07-27 DIAGNOSIS — M9903 Segmental and somatic dysfunction of lumbar region: Secondary | ICD-10-CM | POA: Diagnosis not present

## 2016-08-05 ENCOUNTER — Other Ambulatory Visit: Payer: Self-pay | Admitting: Family Medicine

## 2016-08-09 ENCOUNTER — Other Ambulatory Visit: Payer: Self-pay | Admitting: Family Medicine

## 2016-12-05 ENCOUNTER — Other Ambulatory Visit: Payer: Self-pay | Admitting: Family Medicine

## 2016-12-13 LAB — HM DIABETES EYE EXAM

## 2017-01-03 ENCOUNTER — Other Ambulatory Visit: Payer: Self-pay | Admitting: Family Medicine

## 2017-01-26 ENCOUNTER — Other Ambulatory Visit: Payer: 59

## 2017-01-26 DIAGNOSIS — E781 Pure hyperglyceridemia: Secondary | ICD-10-CM

## 2017-01-26 DIAGNOSIS — E1165 Type 2 diabetes mellitus with hyperglycemia: Secondary | ICD-10-CM

## 2017-01-26 DIAGNOSIS — R7989 Other specified abnormal findings of blood chemistry: Secondary | ICD-10-CM

## 2017-01-26 DIAGNOSIS — Z0000 Encounter for general adult medical examination without abnormal findings: Secondary | ICD-10-CM

## 2017-01-26 DIAGNOSIS — Z79899 Other long term (current) drug therapy: Secondary | ICD-10-CM

## 2017-01-27 LAB — CBC WITH DIFFERENTIAL/PLATELET
BASOS PCT: 0.9 %
Basophils Absolute: 68 cells/uL (ref 0–200)
EOS ABS: 84 {cells}/uL (ref 15–500)
Eosinophils Relative: 1.1 %
HCT: 46.9 % (ref 38.5–50.0)
HEMOGLOBIN: 16.1 g/dL (ref 13.2–17.1)
Lymphs Abs: 2417 cells/uL (ref 850–3900)
MCH: 30.7 pg (ref 27.0–33.0)
MCHC: 34.3 g/dL (ref 32.0–36.0)
MCV: 89.3 fL (ref 80.0–100.0)
MONOS PCT: 8.7 %
MPV: 10.5 fL (ref 7.5–12.5)
NEUTROS ABS: 4370 {cells}/uL (ref 1500–7800)
Neutrophils Relative %: 57.5 %
PLATELETS: 262 10*3/uL (ref 140–400)
RBC: 5.25 10*6/uL (ref 4.20–5.80)
RDW: 12.7 % (ref 11.0–15.0)
TOTAL LYMPHOCYTE: 31.8 %
WBC: 7.6 10*3/uL (ref 3.8–10.8)
WBCMIX: 661 {cells}/uL (ref 200–950)

## 2017-01-27 LAB — MICROALBUMIN / CREATININE URINE RATIO
Creatinine, Urine: 57 mg/dL (ref 20–320)
MICROALB UR: 1.3 mg/dL
MICROALB/CREAT RATIO: 23 ug/mg{creat} (ref <30)

## 2017-01-27 LAB — LIPID PANEL
Cholesterol: 223 mg/dL — ABNORMAL HIGH (ref <200)
HDL: 16 mg/dL — ABNORMAL LOW (ref >40)
NON-HDL CHOLESTEROL (CALC): 207 mg/dL — AB (ref <130)
Total CHOL/HDL Ratio: 13.9 (calc) — ABNORMAL HIGH (ref <5.0)
Triglycerides: 974 mg/dL — ABNORMAL HIGH (ref <150)

## 2017-01-27 LAB — COMPLETE METABOLIC PANEL WITH GFR
AG Ratio: 1.9 (calc) (ref 1.0–2.5)
ALKALINE PHOSPHATASE (APISO): 62 U/L (ref 40–115)
ALT: 23 U/L (ref 9–46)
AST: 18 U/L (ref 10–35)
Albumin: 4.2 g/dL (ref 3.6–5.1)
BUN/Creatinine Ratio: 16 (calc) (ref 6–22)
BUN: 10 mg/dL (ref 7–25)
CHLORIDE: 103 mmol/L (ref 98–110)
CO2: 22 mmol/L (ref 20–32)
CREATININE: 0.62 mg/dL — AB (ref 0.70–1.33)
Calcium: 8.9 mg/dL (ref 8.6–10.3)
GFR, Est African American: 131 mL/min/{1.73_m2} (ref >=60)
GFR, Est Non African American: 113 mL/min/{1.73_m2} (ref >=60)
GLUCOSE: 147 mg/dL — AB (ref 65–99)
Globulin: 2.2 g/dL (calc) (ref 1.9–3.7)
Potassium: 4.1 mmol/L (ref 3.5–5.3)
SODIUM: 137 mmol/L (ref 135–146)
Total Bilirubin: 0.4 mg/dL (ref 0.2–1.2)
Total Protein: 6.4 g/dL (ref 6.1–8.1)

## 2017-01-27 LAB — PSA: PSA: 0.3 ng/mL (ref <=4.0)

## 2017-01-27 LAB — HEMOGLOBIN A1C
Hgb A1c MFr Bld: 7.6 % of total Hgb — ABNORMAL HIGH (ref <5.7)
Mean Plasma Glucose: 171 (calc)
eAG (mmol/L): 9.5 (calc)

## 2017-01-27 LAB — TESTOSTERONE: TESTOSTERONE: 123 ng/dL — AB (ref 250–827)

## 2017-02-02 ENCOUNTER — Encounter: Payer: Self-pay | Admitting: Family Medicine

## 2017-02-02 ENCOUNTER — Ambulatory Visit (INDEPENDENT_AMBULATORY_CARE_PROVIDER_SITE_OTHER): Payer: 59 | Admitting: Family Medicine

## 2017-02-02 VITALS — BP 110/68 | HR 100 | Temp 97.6°F | Resp 16 | Ht 71.0 in | Wt 262.0 lb

## 2017-02-02 DIAGNOSIS — Z23 Encounter for immunization: Secondary | ICD-10-CM | POA: Diagnosis not present

## 2017-02-02 DIAGNOSIS — Z1211 Encounter for screening for malignant neoplasm of colon: Secondary | ICD-10-CM

## 2017-02-02 DIAGNOSIS — E781 Pure hyperglyceridemia: Secondary | ICD-10-CM | POA: Diagnosis not present

## 2017-02-02 DIAGNOSIS — E119 Type 2 diabetes mellitus without complications: Secondary | ICD-10-CM | POA: Diagnosis not present

## 2017-02-02 DIAGNOSIS — Z0000 Encounter for general adult medical examination without abnormal findings: Secondary | ICD-10-CM

## 2017-02-02 MED ORDER — PIOGLITAZONE HCL 30 MG PO TABS: 30.0000 mg | ORAL_TABLET | Freq: Every day | ORAL | 5 refills | Status: DC

## 2017-02-02 MED ORDER — ATORVASTATIN CALCIUM 10 MG PO TABS: 10.0000 mg | ORAL_TABLET | Freq: Every day | ORAL | 3 refills | Status: DC

## 2017-02-02 NOTE — Progress Notes (Signed)
Subjective:    Patient ID: Isaiah Beasley, male    DOB: Aug 12, 1965, 51 y.o.   MRN: [id number]  HPI Patient is here today for a complete physical exam.  Diabetic foot exam is performed today and is normal.  He is listed to be due for a diabetic eye exam but he just had his eyes checked 3 weeks ago.  We have not received communication from that Dr.  He is due for prostate cancer screening.  His most recent PSA was excellent.  He is due for a colonoscopy and he finally agrees to allow me to schedule this.  He is also due for HIV and hepatitis C screening but he continues to defer this.  His most recent lab work as listed below: Lab on 01/26/2017  Component Date Value Ref Range Status  . Testosterone 01/26/2017 123* 250 - 827 ng/dL Final   Comment: In hypogonadal males, Testosterone, Total, LC/MS/MS, is the recommended assay due to the diminished accuracy of immunoassay at levels below 250 ng/dL. This test code [id number]) must be collected in a red-top tube with no gel.    Marland Kitchen PSA 01/26/2017 0.3  < OR = 4.0 ng/mL Final   Comment: The total PSA value from this assay system is  standardized against the WHO standard. The test  result will be approximately 20% lower when compared  to the equimolar-standardized total PSA (Beckman  Coulter). Comparison of serial PSA results should be  interpreted with this fact in mind. . This test was performed using the Siemens  chemiluminescent method. Values obtained from  different assay methods cannot be used interchangeably. PSA levels, regardless of value, should not be interpreted as absolute evidence of the presence or absence of disease.   . Hgb A1c MFr Bld 01/26/2017 7.6* <5.7 % of total Hgb Final   Comment: For someone without known diabetes, a hemoglobin A1c value of 6.5% or greater indicates that they may have  diabetes and this should be confirmed with a follow-up  test. . For someone with known diabetes, a value <7% indicates  that their  diabetes is well controlled and a value  greater than or equal to 7% indicates suboptimal  control. A1c targets should be individualized based on  duration of diabetes, age, comorbid conditions, and  other considerations. . Currently, no consensus exists regarding use of hemoglobin A1c for diagnosis of diabetes for children. .   . Mean Plasma Glucose 01/26/2017 171  (calc) Final  . eAG (mmol/L) 01/26/2017 9.5  (calc) Final  . WBC 01/26/2017 7.6  3.8 - 10.8 Thousand/uL Final  . RBC 01/26/2017 5.25  4.20 - 5.80 Million/uL Final  . Hemoglobin 01/26/2017 16.1  13.2 - 17.1 g/dL Final  . HCT 01/26/2017 46.9  38.5 - 50.0 % Final  . MCV 01/26/2017 89.3  80.0 - 100.0 fL Final  . MCH 01/26/2017 30.7  27.0 - 33.0 pg Final  . MCHC 01/26/2017 34.3  32.0 - 36.0 g/dL Final  . RDW 01/26/2017 12.7  11.0 - 15.0 % Final  . Platelets 01/26/2017 262  140 - 400 Thousand/uL Final  . MPV 01/26/2017 10.5  7.5 - 12.5 fL Final  . Neutro Abs 01/26/2017 4,370  1,500 - 7,800 cells/uL Final  . Lymphs Abs 01/26/2017 2,417  850 - 3,900 cells/uL Final  . WBC mixed population 01/26/2017 661  200 - 950 cells/uL Final  . Eosinophils Absolute 01/26/2017 84  15 - 500 cells/uL Final  . Basophils Absolute 01/26/2017 68  0 -  200 cells/uL Final  . Neutrophils Relative % 01/26/2017 57.5  % Final  . Total Lymphocyte 01/26/2017 31.8  % Final  . Monocytes Relative 01/26/2017 8.7  % Final  . Eosinophils Relative 01/26/2017 1.1  % Final  . Basophils Relative 01/26/2017 0.9  % Final  . Cholesterol 01/26/2017 223* <200 mg/dL Final  . HDL 01/26/2017 16* >40 mg/dL Final  . Triglycerides 01/26/2017 974* <150 mg/dL Final  . LDL Cholesterol (Calc) 01/26/2017   mg/dL (calc) Final   Comment: . LDL cholesterol not calculated. Triglyceride levels greater than 400 mg/dL invalidate calculated LDL results. . Reference range: <100 . Desirable range <100 mg/dL for primary prevention;   <70 mg/dL for patients with CHD or diabetic patients   with > or = 2 CHD risk factors. Marland Kitchen LDL-C is now calculated using the Martin-Hopkins  calculation, which is a validated novel method providing  better accuracy than the Friedewald equation in the  estimation of LDL-C.  Cresenciano Genre et al. Annamaria Helling. 8841;660(63): 2061-2068  (http://education.[facility].com/faq/FAQ164)   . Total CHOL/HDL Ratio 01/26/2017 13.9* <5.0 (calc) Final  . Non-HDL Cholesterol (Calc) 01/26/2017 207* <130 mg/dL (calc) Final   Comment: For patients with diabetes plus 1 major ASCVD risk  factor, treating to a non-HDL-C goal of <100 mg/dL  (LDL-C of <70 mg/dL) is considered a therapeutic  option.   . Glucose, Bld 01/26/2017 147* 65 - 99 mg/dL Final   Comment: .            Fasting reference interval . For someone without known diabetes, a glucose value >125 mg/dL indicates that they may have diabetes and this should be confirmed with a follow-up test. .   . BUN 01/26/2017 10  7 - 25 mg/dL Final  . Creat 01/26/2017 0.62* 0.70 - 1.33 mg/dL Final   Comment: For patients >14 years of age, the reference limit for Creatinine is approximately 13% higher for people identified as African-American. .   . GFR, Est Non African American 01/26/2017 113  > OR = 60 mL/min/1.62m2 Final  . GFR, Est African American 01/26/2017 131  > OR = 60 mL/min/1.89m2 Final  . BUN/Creatinine Ratio 01/26/2017 16  6 - 22 (calc) Final  . Sodium 01/26/2017 137  135 - 146 mmol/L Final  . Potassium 01/26/2017 4.1  3.5 - 5.3 mmol/L Final  . Chloride 01/26/2017 103  98 - 110 mmol/L Final  . CO2 01/26/2017 22  20 - 32 mmol/L Final  . Calcium 01/26/2017 8.9  8.6 - 10.3 mg/dL Final  . Total Protein 01/26/2017 6.4  6.1 - 8.1 g/dL Final  . Albumin 01/26/2017 4.2  3.6 - 5.1 g/dL Final  . Globulin 01/26/2017 2.2  1.9 - 3.7 g/dL (calc) Final  . AG Ratio 01/26/2017 1.9  1.0 - 2.5 (calc) Final  . Total Bilirubin 01/26/2017 0.4  0.2 - 1.2 mg/dL Final  . Alkaline phosphatase (APISO) 01/26/2017 62  40 - 115  U/L Final  . AST 01/26/2017 18  10 - 35 U/L Final  . ALT 01/26/2017 23  9 - 46 U/L Final  . Creatinine, Urine 01/26/2017 57  20 - 320 mg/dL Final  . Microalb, Ur 01/26/2017 1.3  mg/dL Final   Comment: Reference Range Not established   . Microalb Creat Ratio 01/26/2017 23  <30 mcg/mg creat Final   Comment: . The ADA defines abnormalities in albumin excretion as follows: Marland Kitchen Category         Result (mcg/mg creatinine) . Normal                    <  30 Microalbuminuria         30-299  Clinical albuminuria   > OR = 300 . The ADA recommends that at least two of three specimens collected within a 3-6 month period be abnormal before considering a patient to be within a diagnostic category.    Triglycerides are almost 900 despite being on fenofibrate.  Patient continues to exercise every day but is not restricting his diet.  His weight continues to gradually increase and he is taking jardiance.  He is finally willing to try a statin despite his family history of myalgias.  His hemoglobin A1c is also elevated at 7.6.  He denies any polyuria, polydipsia, or blurry vision.  He denies any chest pain shortness of breath or dyspnea on exertion.  He occasionally gets some burning in his feet but it is mild.  He denies any numbness. Past Medical History:  Diagnosis Date  . Diabetes mellitus without complication ([place])   . Hyperlipidemia   . Hypertension   . Wandering (atrial) pacemaker    No past surgical history on file. Current Outpatient Medications on File Prior to Visit  Medication Sig Dispense Refill  . aspirin 81 MG tablet Take 81 mg by mouth daily.    . fenofibrate 160 MG tablet TAKE 1 TABLET BY MOUTH EVERY DAY 30 tablet 3  . JARDIANCE 25 MG TABS tablet TAKE 1 TABLET BY MOUTH EVERY DAY 90 tablet 0  . metFORMIN (GLUCOPHAGE) 1000 MG tablet TAKE 1 TABLET TWICE A DAY WITH A MEAL 60 tablet 5  . valACYclovir (VALTREX) 500 MG tablet TAKE 1 TABLET BY MOUTH TWICE A DAY 30 tablet 6  . VICTOZA 18  MG/3ML SOPN INJECT 1.8 MGS SUBCUTANEOUSLY EVERY DAY 9 pen 5   No current facility-administered medications on file prior to visit.    No Known Allergies Social History   Socioeconomic History  . Marital status: Married    Spouse name: Not on file  . Number of children: Not on file  . Years of education: Not on file  . Highest education level: Not on file  Social Needs  . Financial resource strain: Not on file  . Food insecurity - worry: Not on file  . Food insecurity - inability: Not on file  . Transportation needs - medical: Not on file  . Transportation needs - non-medical: Not on file  Occupational History  . Not on file  Tobacco Use  . Smoking status: Never Smoker  . Smokeless tobacco: Never Used  Substance and Sexual Activity  . Alcohol use: Yes    Comment: Rare  . Drug use: No  . Sexual activity: Not on file  Other Topics Concern  . Not on file  Social History Narrative  . Not on file   No family history on file.    Review of Systems  All other systems reviewed and are negative.      Objective:   Physical Exam  Constitutional: He is oriented to person, place, and time. He appears well-developed and well-nourished. No distress.  HENT:  Head: Normocephalic and atraumatic.  Right Ear: External ear normal.  Left Ear: External ear normal.  Nose: Nose normal.  Mouth/Throat: Oropharynx is clear and moist. No oropharyngeal exudate.  Eyes: Conjunctivae and EOM are normal. Pupils are equal, round, and reactive to light. Right eye exhibits no discharge. Left eye exhibits no discharge. No scleral icterus.  Neck: Normal range of motion. Neck supple. No JVD present. No tracheal deviation present. No thyromegaly present.  Cardiovascular: Normal rate, regular rhythm, normal heart sounds and intact distal pulses. Exam reveals no gallop and no friction rub.  No murmur heard. Pulmonary/Chest: Effort normal and breath sounds normal. No stridor. No respiratory distress. He  has no wheezes. He has no rales. He exhibits no tenderness.  Abdominal: Soft. Bowel sounds are normal. He exhibits no distension and no mass. There is no tenderness. There is no rebound and no guarding.  Musculoskeletal: Normal range of motion. He exhibits no edema, tenderness or deformity.  Lymphadenopathy:    He has no cervical adenopathy.  Neurological: He is alert and oriented to person, place, and time. He has normal reflexes. He displays normal reflexes. No cranial nerve deficit. He exhibits normal muscle tone. Coordination normal.  Skin: Skin is warm. No rash noted. He is not diaphoretic. No erythema. No pallor.  Psychiatric: He has a normal mood and affect. His behavior is normal. Judgment and thought content normal.  Vitals reviewed.         Assessment & Plan:  Diabetes mellitus without complication ([place])  Hypertriglyceridemia  General medical exam  Colon cancer screening - Plan: Ambulatory referral to Gastroenterology  His blood pressure today is excellent.  We will discontinue Jardiance due to his elevated triglycerides.  We will replace it with Actos 30 mg a day.  We did discuss the risk of gaining weight and also bladder cancer and he is willing to accept that.  We also discussed the drastic need for lifestyle changes.  He needs to switch to a vegetarian diet.  He needs to lose 30 pounds.  He needs to exercise 30 minutes every day 5 days a week.  I would also like to add Lipitor 10 mg a day.  He will gradually increase the dose and the frequency of this medication as tolerated.  Recheck labs in 3 months.  I will schedule the patient for a colonoscopy.  His diabetic eye exam and diabetic foot exam are now up-to-date.  He received his flu shot.  He declines HIV and hepatitis C screening.

## 2017-02-02 NOTE — Addendum Note (Signed)
Addended by: Shary Decamp B on: 02/02/2017 09:56 AM   Modules accepted: Orders

## 2017-02-07 ENCOUNTER — Other Ambulatory Visit: Payer: Self-pay | Admitting: Family Medicine

## 2017-02-08 ENCOUNTER — Encounter: Payer: Self-pay | Admitting: Family Medicine

## 2017-03-17 ENCOUNTER — Encounter: Payer: Self-pay | Admitting: Family Medicine

## 2017-03-17 ENCOUNTER — Ambulatory Visit: Payer: 59 | Admitting: Family Medicine

## 2017-03-17 VITALS — BP 138/80 | HR 78 | Temp 98.4°F | Resp 16 | Ht 71.0 in | Wt 276.0 lb

## 2017-03-17 DIAGNOSIS — E781 Pure hyperglyceridemia: Secondary | ICD-10-CM

## 2017-03-17 DIAGNOSIS — E119 Type 2 diabetes mellitus without complications: Secondary | ICD-10-CM | POA: Diagnosis not present

## 2017-03-17 LAB — LIPID PANEL
CHOLESTEROL: 155 mg/dL (ref <200)
HDL: 25 mg/dL — AB (ref >40)
LDL CHOLESTEROL (CALC): 94 mg/dL
Non-HDL Cholesterol (Calc): 130 mg/dL (calc) — ABNORMAL HIGH (ref <130)
TRIGLYCERIDES: 252 mg/dL — AB (ref <150)
Total CHOL/HDL Ratio: 6.2 (calc) — ABNORMAL HIGH (ref <5.0)

## 2017-03-17 LAB — COMPLETE METABOLIC PANEL WITH GFR
AG Ratio: 2.2 (calc) (ref 1.0–2.5)
ALKALINE PHOSPHATASE (APISO): 57 U/L (ref 40–115)
ALT: 30 U/L (ref 9–46)
AST: 17 U/L (ref 10–35)
Albumin: 4.3 g/dL (ref 3.6–5.1)
BILIRUBIN TOTAL: 0.5 mg/dL (ref 0.2–1.2)
BUN/Creatinine Ratio: 19 (calc) (ref 6–22)
BUN: 10 mg/dL (ref 7–25)
CHLORIDE: 103 mmol/L (ref 98–110)
CO2: 25 mmol/L (ref 20–32)
Calcium: 9.2 mg/dL (ref 8.6–10.3)
Creat: 0.52 mg/dL — ABNORMAL LOW (ref 0.70–1.33)
GFR, EST AFRICAN AMERICAN: 141 mL/min/{1.73_m2} (ref >=60)
GFR, Est Non African American: 122 mL/min/{1.73_m2} (ref >=60)
GLUCOSE: 227 mg/dL — AB (ref 65–99)
Globulin: 2 g/dL (calc) (ref 1.9–3.7)
Potassium: 4.2 mmol/L (ref 3.5–5.3)
Sodium: 139 mmol/L (ref 135–146)
TOTAL PROTEIN: 6.3 g/dL (ref 6.1–8.1)

## 2017-03-17 LAB — EXTRA LAV TOP TUBE

## 2017-03-17 MED ORDER — ATORVASTATIN CALCIUM 10 MG PO TABS: 10.0000 mg | ORAL_TABLET | Freq: Every day | ORAL | 3 refills | Status: DC

## 2017-03-17 MED ORDER — GLUCOSE BLOOD VI STRP: ORAL_STRIP | 12 refills | Status: DC

## 2017-03-17 MED ORDER — LIRAGLUTIDE 18 MG/3ML ~~LOC~~ SOPN: PEN_INJECTOR | SUBCUTANEOUS | 5 refills | Status: DC

## 2017-03-17 MED ORDER — METFORMIN HCL 1000 MG PO TABS: ORAL_TABLET | ORAL | 3 refills | Status: DC

## 2017-03-17 MED ORDER — PIOGLITAZONE HCL 30 MG PO TABS: 30.0000 mg | ORAL_TABLET | Freq: Every day | ORAL | 3 refills | Status: DC

## 2017-03-17 MED ORDER — INSULIN GLARGINE 100 UNIT/ML SOLOSTAR PEN: 10.0000 [IU] | PEN_INJECTOR | Freq: Every day | SUBCUTANEOUS | 2 refills | Status: DC

## 2017-03-17 MED ORDER — FENOFIBRATE 160 MG PO TABS: 160.0000 mg | ORAL_TABLET | Freq: Every day | ORAL | 3 refills | Status: DC

## 2017-03-17 NOTE — Progress Notes (Signed)
Subjective:    Patient ID: Isaiah Beasley, male    DOB: 23-Jan-1966, 51 y.o.   MRN: [id number]  HPI  01/26/17 Patient is here today for a complete physical exam.  Diabetic foot exam is performed today and is normal.  He is listed to be due for a diabetic eye exam but he just had his eyes checked 3 weeks ago.  We have not received communication from that Dr.  He is due for prostate cancer screening.  His most recent PSA was excellent.  He is due for a colonoscopy and he finally agrees to allow me to schedule this.  He is also due for HIV and hepatitis C screening but he continues to defer this. Triglycerides are almost 900 despite being on fenofibrate.  Patient continues to exercise every day but is not restricting his diet.  His weight continues to gradually increase and he is taking jardiance.  He is finally willing to try a statin despite his family history of myalgias.  His hemoglobin A1c is also elevated at 7.6.  He denies any polyuria, polydipsia, or blurry vision.  He denies any chest pain shortness of breath or dyspnea on exertion.  He occasionally gets some burning in his feet but it is mild.  He denies any numbness.  At that time, my plan was: His blood pressure today is excellent.  We will discontinue Jardiance due to his elevated triglycerides.  We will replace it with Actos 30 mg a day.  We did discuss the risk of gaining weight and also bladder cancer and he is willing to accept that.  We also discussed the drastic need for lifestyle changes.  He needs to switch to a vegetarian diet.  He needs to lose 30 pounds.  He needs to exercise 30 minutes every day 5 days a week.  I would also like to add Lipitor 10 mg a day.  He will gradually increase the dose and the frequency of this medication as tolerated.  Recheck labs in 3 months.  I will schedule the patient for a colonoscopy.  His diabetic eye exam and diabetic foot exam are now up-to-date.  He received his flu shot.  He declines HIV and  hepatitis C screening.  03/17/17 Patient is here today for follow-up. He is tolerating Lipitor and fenofibrate without any myalgias or right upper quadrant pain. We do want to recheck his fasting lipid panel after 6 weeks to ensure that his triglycerides are falling off a combination of Giardia answer and on the change in medication as well as evaluate his liver function tests for any evidence of drug-induced hepatitis. Unfortunately, since starting Actos, the patient has gained almost 14 pounds. This is unacceptable for him and I agree. He wants to discontinue Actos. However his options now are extremely limited. We had a long discussion and he is willing to try insulin Past Medical History:  Diagnosis Date  . Diabetes mellitus without complication ([place])   . Hyperlipidemia   . Hypertension   . Wandering (atrial) pacemaker     Current Outpatient Medications on File Prior to Visit  Medication Sig Dispense Refill  . aspirin 81 MG tablet Take 81 mg by mouth daily.    . Omega-3 Fatty Acids (FISH OIL) 1200 MG CAPS Take by mouth.    . valACYclovir (VALTREX) 500 MG tablet TAKE 1 TABLET BY MOUTH TWICE A DAY 30 tablet 6   No current facility-administered medications on file prior to visit.    No Known  Allergies Social History   Socioeconomic History  . Marital status: Married    Spouse name: Not on file  . Number of children: Not on file  . Years of education: Not on file  . Highest education level: Not on file  Social Needs  . Financial resource strain: Not on file  . Food insecurity - worry: Not on file  . Food insecurity - inability: Not on file  . Transportation needs - medical: Not on file  . Transportation needs - non-medical: Not on file  Occupational History  . Not on file  Tobacco Use  . Smoking status: Never Smoker  . Smokeless tobacco: Never Used  Substance and Sexual Activity  . Alcohol use: Yes    Comment: Rare  . Drug use: No  . Sexual activity: Not on file  Other  Topics Concern  . Not on file  Social History Narrative  . Not on file   No family history on file.    Review of Systems  All other systems reviewed and are negative.      Objective:   Physical Exam  Constitutional: He is oriented to person, place, and time. He appears well-developed and well-nourished. No distress.  HENT:  Head: Normocephalic and atraumatic.  Right Ear: External ear normal.  Left Ear: External ear normal.  Nose: Nose normal.  Mouth/Throat: Oropharynx is clear and moist. No oropharyngeal exudate.  Eyes: Conjunctivae and EOM are normal. Pupils are equal, round, and reactive to light. Right eye exhibits no discharge. Left eye exhibits no discharge. No scleral icterus.  Neck: Normal range of motion. Neck supple. No JVD present. No tracheal deviation present. No thyromegaly present.  Cardiovascular: Normal rate, regular rhythm, normal heart sounds and intact distal pulses. Exam reveals no gallop and no friction rub.  No murmur heard. Pulmonary/Chest: Effort normal and breath sounds normal. No stridor. No respiratory distress. He has no wheezes. He has no rales. He exhibits no tenderness.  Abdominal: Soft. Bowel sounds are normal. He exhibits no distension and no mass. There is no tenderness. There is no rebound and no guarding.  Musculoskeletal: Normal range of motion. He exhibits no edema, tenderness or deformity.  Lymphadenopathy:    He has no cervical adenopathy.  Neurological: He is alert and oriented to person, place, and time. He has normal reflexes. No cranial nerve deficit. He exhibits normal muscle tone. Coordination normal.  Skin: Skin is warm. No rash noted. He is not diaphoretic. No erythema. No pallor.  Psychiatric: He has a normal mood and affect. His behavior is normal. Judgment and thought content normal.  Vitals reviewed.         Assessment & Plan:  Hypertriglyceridemia - Plan: COMPLETE METABOLIC PANEL WITH GFR, Lipid  panel  DM2-  Discontinue Actos and replaced with Lantus 10 units subcutaneous daily. Patient will check his fasting blood sugar every morning and increase Lantus by 1 unit each day until his fasting blood sugars fall below 130. Once his fasting blood sugars are less than 130 he will maintain that dosage of Lantus. He will continue metformin and Victoza for the present time and call me in one month with his sugars so that we can make an adjustment in his insulin if necessary. Patient is comfortable with this plan. I will recheck a fasting lipid panel today to monitor his liver function tests and his triglyceride levels off the Jardiance and on the Lipitor combined fenofibrate

## 2017-03-18 ENCOUNTER — Encounter: Payer: Self-pay | Admitting: Family Medicine

## 2017-03-20 ENCOUNTER — Encounter: Payer: Self-pay | Admitting: Family Medicine

## 2017-03-20 MED ORDER — GLUCOSE BLOOD VI STRP: ORAL_STRIP | 12 refills | Status: DC

## 2017-03-20 MED ORDER — BASAGLAR KWIKPEN 100 UNIT/ML ~~LOC~~ SOPN: 10.0000 [IU] | PEN_INJECTOR | Freq: Every day | SUBCUTANEOUS | 3 refills | Status: DC

## 2017-03-23 MED ORDER — "PEN NEEDLES 3/16"" 31G X 5 MM MISC": 3 refills | Status: DC

## 2017-03-23 MED ORDER — LIRAGLUTIDE 18 MG/3ML ~~LOC~~ SOPN: PEN_INJECTOR | SUBCUTANEOUS | 5 refills | Status: DC

## 2017-03-30 ENCOUNTER — Encounter: Payer: Self-pay | Admitting: Family Medicine

## 2017-04-06 ENCOUNTER — Encounter: Payer: Self-pay | Admitting: Family Medicine

## 2017-04-09 ENCOUNTER — Other Ambulatory Visit: Payer: Self-pay | Admitting: Family Medicine

## 2017-04-10 ENCOUNTER — Encounter: Payer: Self-pay | Admitting: Family Medicine

## 2017-04-13 ENCOUNTER — Encounter: Payer: Self-pay | Admitting: Family Medicine

## 2017-04-18 ENCOUNTER — Ambulatory Visit: Payer: 59 | Admitting: Family Medicine

## 2017-04-18 VITALS — BP 130/74 | HR 88 | Temp 98.2°F | Resp 16 | Ht 71.0 in | Wt 274.0 lb

## 2017-04-18 DIAGNOSIS — E119 Type 2 diabetes mellitus without complications: Secondary | ICD-10-CM

## 2017-04-18 MED ORDER — BASAGLAR KWIKPEN 100 UNIT/ML ~~LOC~~ SOPN: 40.0000 [IU] | PEN_INJECTOR | Freq: Two times a day (BID) | SUBCUTANEOUS | 3 refills | Status: DC

## 2017-04-18 NOTE — Progress Notes (Signed)
Subjective:    Patient ID: Isaiah Beasley, male    DOB: June 14, 1965, 52 y.o.   MRN: [id number]  Medication Refill     01/26/17 Patient is here today for a complete physical exam.  Diabetic foot exam is performed today and is normal.  He is listed to be due for a diabetic eye exam but he just had his eyes checked 3 weeks ago.  We have not received communication from that Dr.  He is due for prostate cancer screening.  His most recent PSA was excellent.  He is due for a colonoscopy and he finally agrees to allow me to schedule this.  He is also due for HIV and hepatitis C screening but he continues to defer this. Triglycerides are almost 900 despite being on fenofibrate.  Patient continues to exercise every day but is not restricting his diet.  His weight continues to gradually increase and he is taking jardiance.  He is finally willing to try a statin despite his family history of myalgias.  His hemoglobin A1c is also elevated at 7.6.  He denies any polyuria, polydipsia, or blurry vision.  He denies any chest pain shortness of breath or dyspnea on exertion.  He occasionally gets some burning in his feet but it is mild.  He denies any numbness.  At that time, my plan was: His blood pressure today is excellent.  We will discontinue Jardiance due to his elevated triglycerides.  We will replace it with Actos 30 mg a day.  We did discuss the risk of gaining weight and also bladder cancer and he is willing to accept that.  We also discussed the drastic need for lifestyle changes.  He needs to switch to a vegetarian diet.  He needs to lose 30 pounds.  He needs to exercise 30 minutes every day 5 days a week.  I would also like to add Lipitor 10 mg a day.  He will gradually increase the dose and the frequency of this medication as tolerated.  Recheck labs in 3 months.  I will schedule the patient for a colonoscopy.  His diabetic eye exam and diabetic foot exam are now up-to-date.  He received his flu shot.  He  declines HIV and hepatitis C screening.  03/17/17 Patient is here today for follow-up. He is tolerating Lipitor and fenofibrate without any myalgias or right upper quadrant pain. We do want to recheck his fasting lipid panel after 6 weeks to ensure that his triglycerides are falling off a combination of Giardia answer and on the change in medication as well as evaluate his liver function tests for any evidence of drug-induced hepatitis. Unfortunately, since starting Actos, the patient has gained almost 14 pounds. This is unacceptable for him and I agree. He wants to discontinue Actos. However his options now are extremely limited. We had a long discussion and he is willing to try insulin.  At that time, my plan was: Discontinue Actos and replaced with Lantus 10 units subcutaneous daily. Patient will check his fasting blood sugar every morning and increase Lantus by 1 unit each day until his fasting blood sugars fall below 130. Once his fasting blood sugars are less than 130 he will maintain that dosage of Lantus. He will continue metformin and Victoza for the present time and call me in one month with his sugars so that we can make an adjustment in his insulin if necessary. Patient is comfortable with this plan. I will recheck a fasting lipid panel today  to monitor his liver function tests and his triglyceride levels off the Jardiance and on the Lipitor combined fenofibrate  04/18/17 I asked the patient back because his sugar levels are till elevated despite being on high dose basal insulin.  Patient is currently taking 66 units of basaglar.  Fasting blood sugars average between 140 and 160. 2 hour postprandial sugars average between 180 and 240.  He is here today to discuss options to improve his glycemic control      Past Medical History:  Diagnosis Date  . Diabetes mellitus without complication ([place])   . Hyperlipidemia   . Hypertension   . Wandering (atrial) pacemaker     Current Outpatient  Medications on File Prior to Visit  Medication Sig Dispense Refill  . aspirin 81 MG tablet Take 81 mg by mouth daily.    Marland Kitchen atorvastatin (LIPITOR) 10 MG tablet Take 1 tablet (10 mg total) by mouth daily. 90 tablet 3  . fenofibrate 160 MG tablet Take 1 tablet (160 mg total) by mouth daily. 30 tablet 3  . glucose blood test strip Check BS bid - tid 100 each 12  . Insulin Glargine (BASAGLAR KWIKPEN) 100 UNIT/ML SOPN Inject 0.1 mLs (10 Units total) into the skin at bedtime. 5 pen 3  . Insulin Pen Needle (PEN NEEDLES 3/16") 31G X 5 MM MISC Use with Victoza and Basaglar pens 300 each 3  . liraglutide (VICTOZA) 18 MG/3ML SOPN INJECT 1.8 MGS SUBCUTANEOUSLY EVERY DAY 9 pen 5  . metFORMIN (GLUCOPHAGE) 1000 MG tablet TAKE 1 TABLET TWICE A DAY WITH A MEAL 180 tablet 3  . Omega-3 Fatty Acids (FISH OIL) 1200 MG CAPS Take by mouth.    . pioglitazone (ACTOS) 30 MG tablet Take 1 tablet (30 mg total) by mouth daily. 90 tablet 3  . valACYclovir (VALTREX) 500 MG tablet TAKE 1 TABLET BY MOUTH TWICE A DAY 30 tablet 6   No current facility-administered medications on file prior to visit.    No Known Allergies Social History   Socioeconomic History  . Marital status: Married    Spouse name: Not on file  . Number of children: Not on file  . Years of education: Not on file  . Highest education level: Not on file  Social Needs  . Financial resource strain: Not on file  . Food insecurity - worry: Not on file  . Food insecurity - inability: Not on file  . Transportation needs - medical: Not on file  . Transportation needs - non-medical: Not on file  Occupational History  . Not on file  Tobacco Use  . Smoking status: Never Smoker  . Smokeless tobacco: Never Used  Substance and Sexual Activity  . Alcohol use: Yes    Comment: Rare  . Drug use: No  . Sexual activity: Not on file  Other Topics Concern  . Not on file  Social History Narrative  . Not on file   No family history on file.    Review of  Systems  All other systems reviewed and are negative.      Objective:   Physical Exam  Constitutional: He appears well-developed and well-nourished. No distress.  Cardiovascular: Normal rate, regular rhythm, normal heart sounds and intact distal pulses. Exam reveals no gallop and no friction rub.  No murmur heard. Pulmonary/Chest: Effort normal and breath sounds normal. No respiratory distress. He has no wheezes. He has no rales. He exhibits no tenderness.  Musculoskeletal: He exhibits no edema.  Skin: He is  not diaphoretic.  Vitals reviewed.         Assessment & Plan:  IDDM  We discussed options including increasing basal insulin versus adding mealtime insulin versus consult endocrinology. Ultimately we decided to increase his basal insulin in split it to twice daily. He will take 40 units twice daily. He will continue to record his sugars and report to me in one to 2 weeks. If we're not seeing improvement at that point, I would suggest adding mealtime insulin or consult endocrinology

## 2017-04-26 ENCOUNTER — Encounter: Payer: Self-pay | Admitting: Family Medicine

## 2017-05-05 ENCOUNTER — Ambulatory Visit
Admission: RE | Admit: 2017-05-05 | Discharge: 2017-05-05 | Disposition: A | Discharge status: Home / Self Care | Payer: Self-pay | Source: Ambulatory Visit | Attending: Family Medicine | Admitting: Family Medicine

## 2017-05-05 ENCOUNTER — Ambulatory Visit: Payer: 59 | Admitting: Family Medicine

## 2017-05-05 VITALS — BP 100/64 | HR 64 | Temp 97.7°F | Resp 16 | Ht 71.0 in | Wt 274.0 lb

## 2017-05-05 DIAGNOSIS — R071 Chest pain on breathing: Secondary | ICD-10-CM | POA: Diagnosis not present

## 2017-05-05 DIAGNOSIS — E781 Pure hyperglyceridemia: Secondary | ICD-10-CM

## 2017-05-05 DIAGNOSIS — R079 Chest pain, unspecified: Secondary | ICD-10-CM | POA: Diagnosis not present

## 2017-05-05 DIAGNOSIS — E119 Type 2 diabetes mellitus without complications: Secondary | ICD-10-CM

## 2017-05-05 MED ORDER — IOPAMIDOL (ISOVUE-370) INJECTION 76%
75.0000 mL | Freq: Once | INTRAVENOUS | Status: AC | PRN
  Administered 2017-05-05: 75 mL via INTRAVENOUS

## 2017-05-05 NOTE — Progress Notes (Signed)
Subjective:    Patient ID: Isaiah Beasley, male    DOB: June 14, 1965, 52 y.o.   MRN: [id number]  Medication Refill     01/26/17 Patient is here today for a complete physical exam.  Diabetic foot exam is performed today and is normal.  He is listed to be due for a diabetic eye exam but he just had his eyes checked 3 weeks ago.  We have not received communication from that Dr.  He is due for prostate cancer screening.  His most recent PSA was excellent.  He is due for a colonoscopy and he finally agrees to allow me to schedule this.  He is also due for HIV and hepatitis C screening but he continues to defer this. Triglycerides are almost 900 despite being on fenofibrate.  Patient continues to exercise every day but is not restricting his diet.  His weight continues to gradually increase and he is taking jardiance.  He is finally willing to try a statin despite his family history of myalgias.  His hemoglobin A1c is also elevated at 7.6.  He denies any polyuria, polydipsia, or blurry vision.  He denies any chest pain shortness of breath or dyspnea on exertion.  He occasionally gets some burning in his feet but it is mild.  He denies any numbness.  At that time, my plan was: His blood pressure today is excellent.  We will discontinue Jardiance due to his elevated triglycerides.  We will replace it with Actos 30 mg a day.  We did discuss the risk of gaining weight and also bladder cancer and he is willing to accept that.  We also discussed the drastic need for lifestyle changes.  He needs to switch to a vegetarian diet.  He needs to lose 30 pounds.  He needs to exercise 30 minutes every day 5 days a week.  I would also like to add Lipitor 10 mg a day.  He will gradually increase the dose and the frequency of this medication as tolerated.  Recheck labs in 3 months.  I will schedule the patient for a colonoscopy.  His diabetic eye exam and diabetic foot exam are now up-to-date.  He received his flu shot.  He  declines HIV and hepatitis C screening.  03/17/17 Patient is here today for follow-up. He is tolerating Lipitor and fenofibrate without any myalgias or right upper quadrant pain. We do want to recheck his fasting lipid panel after 6 weeks to ensure that his triglycerides are falling off a combination of Giardia answer and on the change in medication as well as evaluate his liver function tests for any evidence of drug-induced hepatitis. Unfortunately, since starting Actos, the patient has gained almost 14 pounds. This is unacceptable for him and I agree. He wants to discontinue Actos. However his options now are extremely limited. We had a long discussion and he is willing to try insulin.  At that time, my plan was: Discontinue Actos and replaced with Lantus 10 units subcutaneous daily. Patient will check his fasting blood sugar every morning and increase Lantus by 1 unit each day until his fasting blood sugars fall below 130. Once his fasting blood sugars are less than 130 he will maintain that dosage of Lantus. He will continue metformin and Victoza for the present time and call me in one month with his sugars so that we can make an adjustment in his insulin if necessary. Patient is comfortable with this plan. I will recheck a fasting lipid panel today  to monitor his liver function tests and his triglyceride levels off the Jardiance and on the Lipitor combined fenofibrate  04/18/17 I asked the patient back because his sugar levels are till elevated despite being on high dose basal insulin.  Patient is currently taking 66 units of basaglar.  Fasting blood sugars average between 140 and 160. 2 hour postprandial sugars average between 180 and 240.  He is here today to discuss options to improve his glycemic control.  At that time, my plan was:  We discussed options including increasing basal insulin versus adding mealtime insulin versus consult endocrinology. Ultimately we decided to increase his basal  insulin in split it to twice daily. He will take 40 units twice daily. He will continue to record his sugars and report to me in one to 2 weeks. If we're not seeing improvement at that point, I would suggest adding mealtime insulin or consult endocrinology  05/05/17 Here for follow up.  Recently got back from [facility].  After the plane flight, he noticed swelling in both legs.  Earlier this week, he developed pleuritic pain in his right lung.  Pain is located below his right nipple near the gallbladder and radiates to his right shoulder blade.  It is not related to food.  He does report some mild shortness of breath.  He denies any fevers or chills.  He denies any coughing.  His blood sugars are typically between 100-200.  Sugars in the morning and between 101 150.  2-hour postprandial sugars are between 150 and 230.    Past Medical History:  Diagnosis Date  . Diabetes mellitus without complication ([place])   . Hyperlipidemia   . Hypertension   . Wandering (atrial) pacemaker     Current Outpatient Medications on File Prior to Visit  Medication Sig Dispense Refill  . aspirin 81 MG tablet Take 81 mg by mouth daily.    Marland Kitchen atorvastatin (LIPITOR) 10 MG tablet Take 1 tablet (10 mg total) by mouth daily. 90 tablet 3  . fenofibrate 160 MG tablet Take 1 tablet (160 mg total) by mouth daily. 30 tablet 3  . glucose blood test strip Check BS bid - tid 100 each 12  . Insulin Glargine (BASAGLAR KWIKPEN) 100 UNIT/ML SOPN Inject 0.4 mLs (40 Units total) into the skin 2 (two) times daily. 5 pen 3  . Insulin Pen Needle (PEN NEEDLES 3/16") 31G X 5 MM MISC Use with Victoza and Basaglar pens 300 each 3  . liraglutide (VICTOZA) 18 MG/3ML SOPN INJECT 1.8 MGS SUBCUTANEOUSLY EVERY DAY 9 pen 5  . metFORMIN (GLUCOPHAGE) 1000 MG tablet TAKE 1 TABLET TWICE A DAY WITH A MEAL 180 tablet 3  . Omega-3 Fatty Acids (FISH OIL) 1200 MG CAPS Take by mouth.    . valACYclovir (VALTREX) 500 MG tablet TAKE 1 TABLET BY MOUTH TWICE A DAY  30 tablet 6   No current facility-administered medications on file prior to visit.    No Known Allergies Social History   Socioeconomic History  . Marital status: Married    Spouse name: Not on file  . Number of children: Not on file  . Years of education: Not on file  . Highest education level: Not on file  Social Needs  . Financial resource strain: Not on file  . Food insecurity - worry: Not on file  . Food insecurity - inability: Not on file  . Transportation needs - medical: Not on file  . Transportation needs - non-medical: Not on file  Occupational History  . Not on file  Tobacco Use  . Smoking status: Never Smoker  . Smokeless tobacco: Never Used  Substance and Sexual Activity  . Alcohol use: Yes    Comment: Rare  . Drug use: No  . Sexual activity: Not on file  Other Topics Concern  . Not on file  Social History Narrative  . Not on file   No family history on file.    Review of Systems  All other systems reviewed and are negative.      Objective:   Physical Exam  Constitutional: He appears well-developed and well-nourished. No distress.  Cardiovascular: Normal rate, regular rhythm, normal heart sounds and intact distal pulses. Exam reveals no gallop and no friction rub.  No murmur heard. Pulmonary/Chest: Effort normal and breath sounds normal. No respiratory distress. He has no wheezes. He has no rales. He exhibits no tenderness.  Musculoskeletal: He exhibits no edema.  Skin: He is not diaphoretic.  Vitals reviewed.         Assessment & Plan:  Diabetes mellitus without complication ([place]) - Plan: CBC with Differential/Platelet, COMPLETE METABOLIC PANEL WITH GFR, Lipid panel, Hemoglobin A1c, Microalbumin, urine  Hypertriglyceridemia  Chest pain on breathing - Plan: CT Angio Chest W/Cm &/Or Wo Cm  Given his history, I am concerned about a pulmonary embolism.  Begin Xarelto 15 mg p.o. twice daily and send the patient immediately for a CT scan of the  chest to evaluate further.  He will take the first dose of Xarelto now.  If the CT scan is negative for pulmonary embolism, we will discontinue the Xarelto.  If it is positive, I will recheck the patient on Monday and if symptoms worsen he is instructed to go the emergency room.  If the CT scan is negative, consider muscle strain as potential cause for the chest wall pain.  Also consider gallbladder given its location.  Blood sugars are much better but are still slightly elevated.  I gave the patient the option about increasing his insulin to 45 units twice a day versus resuming jardiance.  Only issue is he had elevated triglycerides previously on that medication.  Therefore the patient elects to check his triglycerides first prior to making a decision between the 2 options.  Await the report on the CT scan

## 2017-05-06 LAB — CBC WITH DIFFERENTIAL/PLATELET
BASOS ABS: 58 {cells}/uL (ref 0–200)
Basophils Relative: 0.9 %
EOS ABS: 109 {cells}/uL (ref 15–500)
Eosinophils Relative: 1.7 %
HCT: 43.8 % (ref 38.5–50.0)
HEMOGLOBIN: 14.9 g/dL (ref 13.2–17.1)
Lymphs Abs: 2534 cells/uL (ref 850–3900)
MCH: 30.5 pg (ref 27.0–33.0)
MCHC: 34 g/dL (ref 32.0–36.0)
MCV: 89.8 fL (ref 80.0–100.0)
MPV: 11.1 fL (ref 7.5–12.5)
Monocytes Relative: 9.7 %
NEUTROS ABS: 3078 {cells}/uL (ref 1500–7800)
Neutrophils Relative %: 48.1 %
Platelets: 237 10*3/uL (ref 140–400)
RBC: 4.88 10*6/uL (ref 4.20–5.80)
RDW: 12.6 % (ref 11.0–15.0)
Total Lymphocyte: 39.6 %
WBC mixed population: 621 cells/uL (ref 200–950)
WBC: 6.4 10*3/uL (ref 3.8–10.8)

## 2017-05-06 LAB — COMPLETE METABOLIC PANEL WITH GFR
AG RATIO: 2.1 (calc) (ref 1.0–2.5)
ALBUMIN MSPROF: 4.2 g/dL (ref 3.6–5.1)
ALKALINE PHOSPHATASE (APISO): 48 U/L (ref 40–115)
ALT: 28 U/L (ref 9–46)
AST: 16 U/L (ref 10–35)
BUN / CREAT RATIO: 19 (calc) (ref 6–22)
BUN: 10 mg/dL (ref 7–25)
CO2: 28 mmol/L (ref 20–32)
Calcium: 9.4 mg/dL (ref 8.6–10.3)
Chloride: 106 mmol/L (ref 98–110)
Creat: 0.54 mg/dL — ABNORMAL LOW (ref 0.70–1.33)
GFR, Est African American: 139 mL/min/{1.73_m2} (ref >=60)
GFR, Est Non African American: 120 mL/min/{1.73_m2} (ref >=60)
GLOBULIN: 2 g/dL (ref 1.9–3.7)
Glucose, Bld: 152 mg/dL — ABNORMAL HIGH (ref 65–99)
Potassium: 4.5 mmol/L (ref 3.5–5.3)
SODIUM: 141 mmol/L (ref 135–146)
Total Bilirubin: 0.5 mg/dL (ref 0.2–1.2)
Total Protein: 6.2 g/dL (ref 6.1–8.1)

## 2017-05-06 LAB — MICROALBUMIN, URINE: MICROALB UR: 3.4 mg/dL

## 2017-05-06 LAB — LIPID PANEL
CHOL/HDL RATIO: 5.4 (calc) — AB (ref <5.0)
CHOLESTEROL: 119 mg/dL (ref <200)
HDL: 22 mg/dL — ABNORMAL LOW (ref >40)
LDL Cholesterol (Calc): 77 mg/dL (calc)
Non-HDL Cholesterol (Calc): 97 mg/dL (calc) (ref <130)
Triglycerides: 110 mg/dL (ref <150)

## 2017-05-06 LAB — HEMOGLOBIN A1C
EAG (MMOL/L): 9.8 (calc)
Hgb A1c MFr Bld: 7.8 % of total Hgb — ABNORMAL HIGH (ref <5.7)
MEAN PLASMA GLUCOSE: 177 (calc)

## 2017-05-08 ENCOUNTER — Encounter: Payer: Self-pay | Admitting: Family Medicine

## 2017-05-08 MED ORDER — EMPAGLIFLOZIN 25 MG PO TABS: 25.0000 mg | ORAL_TABLET | Freq: Every day | ORAL | 0 refills | Status: DC

## 2017-05-08 MED ORDER — EMPAGLIFLOZIN 25 MG PO TABS: 25.0000 mg | ORAL_TABLET | Freq: Every day | ORAL | 1 refills | Status: DC

## 2017-05-08 NOTE — Addendum Note (Signed)
Addended by: Shary Decamp B on: 05/08/2017 02:32 PM   Modules accepted: Orders

## 2017-06-02 ENCOUNTER — Encounter: Payer: Self-pay | Admitting: Family Medicine

## 2017-06-06 ENCOUNTER — Ambulatory Visit: Payer: 59 | Admitting: Family Medicine

## 2017-06-06 ENCOUNTER — Encounter: Payer: Self-pay | Admitting: Family Medicine

## 2017-06-06 VITALS — BP 126/70 | HR 78 | Temp 97.2°F | Resp 16 | Ht 71.0 in | Wt 273.0 lb

## 2017-06-06 DIAGNOSIS — K8050 Calculus of bile duct without cholangitis or cholecystitis without obstruction: Secondary | ICD-10-CM | POA: Diagnosis not present

## 2017-06-06 MED ORDER — OXYCODONE HCL 5 MG PO TABS: 5.0000 mg | ORAL_TABLET | ORAL | 0 refills | Status: DC | PRN

## 2017-06-06 NOTE — Progress Notes (Signed)
Subjective:    Patient ID: Isaiah Beasley, male    DOB: June 14, 1965, 52 y.o.   MRN: [id number]  Medication Refill     01/26/17 Patient is here today for a complete physical exam.  Diabetic foot exam is performed today and is normal.  He is listed to be due for a diabetic eye exam but he just had his eyes checked 3 weeks ago.  We have not received communication from that Dr.  He is due for prostate cancer screening.  His most recent PSA was excellent.  He is due for a colonoscopy and he finally agrees to allow me to schedule this.  He is also due for HIV and hepatitis C screening but he continues to defer this. Triglycerides are almost 900 despite being on fenofibrate.  Patient continues to exercise every day but is not restricting his diet.  His weight continues to gradually increase and he is taking jardiance.  He is finally willing to try a statin despite his family history of myalgias.  His hemoglobin A1c is also elevated at 7.6.  He denies any polyuria, polydipsia, or blurry vision.  He denies any chest pain shortness of breath or dyspnea on exertion.  He occasionally gets some burning in his feet but it is mild.  He denies any numbness.  At that time, my plan was: His blood pressure today is excellent.  We will discontinue Jardiance due to his elevated triglycerides.  We will replace it with Actos 30 mg a day.  We did discuss the risk of gaining weight and also bladder cancer and he is willing to accept that.  We also discussed the drastic need for lifestyle changes.  He needs to switch to a vegetarian diet.  He needs to lose 30 pounds.  He needs to exercise 30 minutes every day 5 days a week.  I would also like to add Lipitor 10 mg a day.  He will gradually increase the dose and the frequency of this medication as tolerated.  Recheck labs in 3 months.  I will schedule the patient for a colonoscopy.  His diabetic eye exam and diabetic foot exam are now up-to-date.  He received his flu shot.  He  declines HIV and hepatitis C screening.  03/17/17 Patient is here today for follow-up. He is tolerating Lipitor and fenofibrate without any myalgias or right upper quadrant pain. We do want to recheck his fasting lipid panel after 6 weeks to ensure that his triglycerides are falling off a combination of Giardia answer and on the change in medication as well as evaluate his liver function tests for any evidence of drug-induced hepatitis. Unfortunately, since starting Actos, the patient has gained almost 14 pounds. This is unacceptable for him and I agree. He wants to discontinue Actos. However his options now are extremely limited. We had a long discussion and he is willing to try insulin.  At that time, my plan was: Discontinue Actos and replaced with Lantus 10 units subcutaneous daily. Patient will check his fasting blood sugar every morning and increase Lantus by 1 unit each day until his fasting blood sugars fall below 130. Once his fasting blood sugars are less than 130 he will maintain that dosage of Lantus. He will continue metformin and Victoza for the present time and call me in one month with his sugars so that we can make an adjustment in his insulin if necessary. Patient is comfortable with this plan. I will recheck a fasting lipid panel today  to monitor his liver function tests and his triglyceride levels off the Jardiance and on the Lipitor combined fenofibrate  04/18/17 I asked the patient back because his sugar levels are till elevated despite being on high dose basal insulin.  Patient is currently taking 66 units of basaglar.  Fasting blood sugars average between 140 and 160. 2 hour postprandial sugars average between 180 and 240.  He is here today to discuss options to improve his glycemic control.  At that time, my plan was:  We discussed options including increasing basal insulin versus adding mealtime insulin versus consult endocrinology. Ultimately we decided to increase his basal  insulin in split it to twice daily. He will take 40 units twice daily. He will continue to record his sugars and report to me in one to 2 weeks. If we're not seeing improvement at that point, I would suggest adding mealtime insulin or consult endocrinology  05/05/17 Here for follow up.  Recently got back from [facility].  After the plane flight, he noticed swelling in both legs.  Earlier this week, he developed pleuritic pain in his right lung.  Pain is located below his right nipple near the gallbladder and radiates to his right shoulder blade.  It is not related to food.  He does report some mild shortness of breath.  He denies any fevers or chills.  He denies any coughing.  His blood sugars are typically between 100-200.  Sugars in the morning and between 101 150.  2-hour postprandial sugars are between 150 and 230.  At that time, my plan was: Given his history, I am concerned about a pulmonary embolism.  Begin Xarelto 15 mg p.o. twice daily and send the patient immediately for a CT scan of the chest to evaluate further.  He will take the first dose of Xarelto now.  If the CT scan is negative for pulmonary embolism, we will discontinue the Xarelto.  If it is positive, I will recheck the patient on Monday and if symptoms worsen he is instructed to go the emergency room.  If the CT scan is negative, consider muscle strain as potential cause for the chest wall pain.  Also consider gallbladder given its location.  Blood sugars are much better but are still slightly elevated.  I gave the patient the option about increasing his insulin to 45 units twice a day versus resuming jardiance.  Only issue is he had elevated triglycerides previously on that medication.  Therefore the patient elects to check his triglycerides first prior to making a decision between the 2 options.  Await the report on the CT scan  06/06/17 Here for follow up.  CT was negative for PE.  SInce I last saw him, he is having episodic ruq pain  radiating into his right flank.  Pain comes and goes at will.  Denies fever, jaundice, or constant pain.  Denies melena or hematochezia.  Denies cough or pleurisy.      Past Medical History:  Diagnosis Date  . Diabetes mellitus without complication ([place])   . Hyperlipidemia   . Hypertension   . Wandering (atrial) pacemaker     Current Outpatient Medications on File Prior to Visit  Medication Sig Dispense Refill  . aspirin 81 MG tablet Take 81 mg by mouth daily.    Marland Kitchen atorvastatin (LIPITOR) 10 MG tablet Take 1 tablet (10 mg total) by mouth daily. 90 tablet 3  . empagliflozin (JARDIANCE) 25 MG TABS tablet Take 25 mg by mouth daily. [place]  tablet 0  . empagliflozin (JARDIANCE) 25 MG TABS tablet Take 25 mg by mouth daily. 90 tablet 1  . fenofibrate 160 MG tablet Take 1 tablet (160 mg total) by mouth daily. 30 tablet 3  . glucose blood test strip Check BS bid - tid 100 each 12  . Insulin Glargine (BASAGLAR KWIKPEN) 100 UNIT/ML SOPN Inject 0.4 mLs (40 Units total) into the skin 2 (two) times daily. 5 pen 3  . Insulin Pen Needle (PEN NEEDLES 3/16") 31G X 5 MM MISC Use with Victoza and Basaglar pens 300 each 3  . liraglutide (VICTOZA) 18 MG/3ML SOPN INJECT 1.8 MGS SUBCUTANEOUSLY EVERY DAY 9 pen 5  . metFORMIN (GLUCOPHAGE) 1000 MG tablet TAKE 1 TABLET TWICE A DAY WITH A MEAL 180 tablet 3  . Omega-3 Fatty Acids (FISH OIL) 1200 MG CAPS Take by mouth.    . valACYclovir (VALTREX) 500 MG tablet TAKE 1 TABLET BY MOUTH TWICE A DAY 30 tablet 6   No current facility-administered medications on file prior to visit.    No Known Allergies Social History   Socioeconomic History  . Marital status: Married    Spouse name: Not on file  . Number of children: Not on file  . Years of education: Not on file  . Highest education level: Not on file  Social Needs  . Financial resource strain: Not on file  . Food insecurity - worry: Not on file  . Food insecurity - inability: Not on file  . Transportation needs -  medical: Not on file  . Transportation needs - non-medical: Not on file  Occupational History  . Not on file  Tobacco Use  . Smoking status: Never Smoker  . Smokeless tobacco: Never Used  Substance and Sexual Activity  . Alcohol use: Yes    Comment: Rare  . Drug use: No  . Sexual activity: Not on file  Other Topics Concern  . Not on file  Social History Narrative  . Not on file   No family history on file.    Review of Systems  All other systems reviewed and are negative.      Objective:   Physical Exam  Constitutional: He appears well-developed and well-nourished. No distress.  Cardiovascular: Normal rate, regular rhythm, normal heart sounds and intact distal pulses. Exam reveals no gallop and no friction rub.  No murmur heard. Pulmonary/Chest: Effort normal and breath sounds normal. No respiratory distress. He has no wheezes. He has no rales. He exhibits no tenderness.  Musculoskeletal: He exhibits no edema.  Skin: He is not diaphoretic.  Vitals reviewed.         Assessment & Plan:  Biliary colic - Plan: oxyCODONE (ROXICODONE) 5 MG immediate release tablet, CBC with Differential/Platelet, COMPLETE METABOLIC PANEL WITH GFR, Lipase, US Abdomen Limited RUQ  Suspect biliary colic.  Hold metformin, lipitor, and fenofibrate.  Check cbc, cmp, lipase.  Use oxycodone for pain.  GO to er if pain is unrelenting or triad of symptoms develop.  Meanwhile, schedule ruq [place]. asap.

## 2017-06-07 ENCOUNTER — Ambulatory Visit
Admission: RE | Admit: 2017-06-07 | Discharge: 2017-06-07 | Disposition: A | Discharge status: Home / Self Care | Payer: 59 | Source: Ambulatory Visit | Attending: Family Medicine | Admitting: Family Medicine

## 2017-06-07 ENCOUNTER — Encounter: Payer: Self-pay | Admitting: Family Medicine

## 2017-06-07 DIAGNOSIS — K8050 Calculus of bile duct without cholangitis or cholecystitis without obstruction: Secondary | ICD-10-CM

## 2017-06-07 DIAGNOSIS — K760 Fatty (change of) liver, not elsewhere classified: Secondary | ICD-10-CM | POA: Diagnosis not present

## 2017-06-07 LAB — COMPLETE METABOLIC PANEL WITH GFR
AG RATIO: 2 (calc) (ref 1.0–2.5)
ALBUMIN MSPROF: 4.3 g/dL (ref 3.6–5.1)
ALKALINE PHOSPHATASE (APISO): 48 U/L (ref 40–115)
ALT: 24 U/L (ref 9–46)
AST: 19 U/L (ref 10–35)
BILIRUBIN TOTAL: 0.5 mg/dL (ref 0.2–1.2)
BUN/Creatinine Ratio: 18 (calc) (ref 6–22)
BUN: 11 mg/dL (ref 7–25)
CO2: 26 mmol/L (ref 20–32)
Calcium: 9.4 mg/dL (ref 8.6–10.3)
Chloride: 107 mmol/L (ref 98–110)
Creat: 0.62 mg/dL — ABNORMAL LOW (ref 0.70–1.33)
GFR, Est African American: 131 mL/min/{1.73_m2} (ref >=60)
GFR, Est Non African American: 113 mL/min/{1.73_m2} (ref >=60)
GLOBULIN: 2.2 g/dL (ref 1.9–3.7)
Glucose, Bld: 127 mg/dL — ABNORMAL HIGH (ref 65–99)
POTASSIUM: 4.1 mmol/L (ref 3.5–5.3)
SODIUM: 143 mmol/L (ref 135–146)
Total Protein: 6.5 g/dL (ref 6.1–8.1)

## 2017-06-07 LAB — CBC WITH DIFFERENTIAL/PLATELET
BASOS ABS: 58 {cells}/uL (ref 0–200)
Basophils Relative: 0.8 %
EOS ABS: 137 {cells}/uL (ref 15–500)
EOS PCT: 1.9 %
HEMATOCRIT: 49.2 % (ref 38.5–50.0)
Hemoglobin: 16.2 g/dL (ref 13.2–17.1)
LYMPHS ABS: 2844 {cells}/uL (ref 850–3900)
MCH: 30.2 pg (ref 27.0–33.0)
MCHC: 32.9 g/dL (ref 32.0–36.0)
MCV: 91.6 fL (ref 80.0–100.0)
MPV: 10.9 fL (ref 7.5–12.5)
Monocytes Relative: 10.1 %
NEUTROS PCT: 47.7 %
Neutro Abs: 3434 cells/uL (ref 1500–7800)
Platelets: 251 10*3/uL (ref 140–400)
RBC: 5.37 10*6/uL (ref 4.20–5.80)
RDW: 13.3 % (ref 11.0–15.0)
Total Lymphocyte: 39.5 %
WBC mixed population: 727 cells/uL (ref 200–950)
WBC: 7.2 10*3/uL (ref 3.8–10.8)

## 2017-06-07 LAB — LIPASE: LIPASE: 59 U/L (ref 7–60)

## 2017-06-08 ENCOUNTER — Other Ambulatory Visit: Payer: Self-pay | Admitting: Family Medicine

## 2017-06-08 ENCOUNTER — Encounter: Payer: Self-pay | Admitting: Family Medicine

## 2017-06-08 DIAGNOSIS — K8050 Calculus of bile duct without cholangitis or cholecystitis without obstruction: Secondary | ICD-10-CM

## 2017-06-14 ENCOUNTER — Encounter: Payer: Self-pay | Admitting: Family Medicine

## 2017-06-15 ENCOUNTER — Other Ambulatory Visit: Payer: Self-pay

## 2017-06-18 ENCOUNTER — Other Ambulatory Visit: Payer: Self-pay | Admitting: Family Medicine

## 2017-06-21 ENCOUNTER — Encounter: Payer: Self-pay | Admitting: Family Medicine

## 2017-06-21 ENCOUNTER — Ambulatory Visit: Payer: 59 | Admitting: Family Medicine

## 2017-06-21 DIAGNOSIS — K8050 Calculus of bile duct without cholangitis or cholecystitis without obstruction: Secondary | ICD-10-CM

## 2017-06-21 MED ORDER — OXYCODONE HCL 5 MG PO TABS: 5.0000 mg | ORAL_TABLET | ORAL | 0 refills | Status: DC | PRN

## 2017-06-21 NOTE — Progress Notes (Signed)
Subjective:    Patient ID: Isaiah Beasley, male    DOB: June 14, 1965, 52 y.o.   MRN: [id number]  Medication Refill     01/26/17 Patient is here today for a complete physical exam.  Diabetic foot exam is performed today and is normal.  He is listed to be due for a diabetic eye exam but he just had his eyes checked 3 weeks ago.  We have not received communication from that Dr.  He is due for prostate cancer screening.  His most recent PSA was excellent.  He is due for a colonoscopy and he finally agrees to allow me to schedule this.  He is also due for HIV and hepatitis C screening but he continues to defer this. Triglycerides are almost 900 despite being on fenofibrate.  Patient continues to exercise every day but is not restricting his diet.  His weight continues to gradually increase and he is taking jardiance.  He is finally willing to try a statin despite his family history of myalgias.  His hemoglobin A1c is also elevated at 7.6.  He denies any polyuria, polydipsia, or blurry vision.  He denies any chest pain shortness of breath or dyspnea on exertion.  He occasionally gets some burning in his feet but it is mild.  He denies any numbness.  At that time, my plan was: His blood pressure today is excellent.  We will discontinue Jardiance due to his elevated triglycerides.  We will replace it with Actos 30 mg a day.  We did discuss the risk of gaining weight and also bladder cancer and he is willing to accept that.  We also discussed the drastic need for lifestyle changes.  He needs to switch to a vegetarian diet.  He needs to lose 30 pounds.  He needs to exercise 30 minutes every day 5 days a week.  I would also like to add Lipitor 10 mg a day.  He will gradually increase the dose and the frequency of this medication as tolerated.  Recheck labs in 3 months.  I will schedule the patient for a colonoscopy.  His diabetic eye exam and diabetic foot exam are now up-to-date.  He received his flu shot.  He  declines HIV and hepatitis C screening.  03/17/17 Patient is here today for follow-up. He is tolerating Lipitor and fenofibrate without any myalgias or right upper quadrant pain. We do want to recheck his fasting lipid panel after 6 weeks to ensure that his triglycerides are falling off a combination of Giardia answer and on the change in medication as well as evaluate his liver function tests for any evidence of drug-induced hepatitis. Unfortunately, since starting Actos, the patient has gained almost 14 pounds. This is unacceptable for him and I agree. He wants to discontinue Actos. However his options now are extremely limited. We had a long discussion and he is willing to try insulin.  At that time, my plan was: Discontinue Actos and replaced with Lantus 10 units subcutaneous daily. Patient will check his fasting blood sugar every morning and increase Lantus by 1 unit each day until his fasting blood sugars fall below 130. Once his fasting blood sugars are less than 130 he will maintain that dosage of Lantus. He will continue metformin and Victoza for the present time and call me in one month with his sugars so that we can make an adjustment in his insulin if necessary. Patient is comfortable with this plan. I will recheck a fasting lipid panel today  to monitor his liver function tests and his triglyceride levels off the Jardiance and on the Lipitor combined fenofibrate  04/18/17 I asked the patient back because his sugar levels are till elevated despite being on high dose basal insulin.  Patient is currently taking 66 units of basaglar.  Fasting blood sugars average between 140 and 160. 2 hour postprandial sugars average between 180 and 240.  He is here today to discuss options to improve his glycemic control.  At that time, my plan was:  We discussed options including increasing basal insulin versus adding mealtime insulin versus consult endocrinology. Ultimately we decided to increase his basal  insulin in split it to twice daily. He will take 40 units twice daily. He will continue to record his sugars and report to me in one to 2 weeks. If we're not seeing improvement at that point, I would suggest adding mealtime insulin or consult endocrinology  05/05/17 Here for follow up.  Recently got back from [facility].  After the plane flight, he noticed swelling in both legs.  Earlier this week, he developed pleuritic pain in his right lung.  Pain is located below his right nipple near the gallbladder and radiates to his right shoulder blade.  It is not related to food.  He does report some mild shortness of breath.  He denies any fevers or chills.  He denies any coughing.  His blood sugars are typically between 100-200.  Sugars in the morning and between 101 150.  2-hour postprandial sugars are between 150 and 230.  At that time, my plan was: Given his history, I am concerned about a pulmonary embolism.  Begin Xarelto 15 mg p.o. twice daily and send the patient immediately for a CT scan of the chest to evaluate further.  He will take the first dose of Xarelto now.  If the CT scan is negative for pulmonary embolism, we will discontinue the Xarelto.  If it is positive, I will recheck the patient on Monday and if symptoms worsen he is instructed to go the emergency room.  If the CT scan is negative, consider muscle strain as potential cause for the chest wall pain.  Also consider gallbladder given its location.  Blood sugars are much better but are still slightly elevated.  I gave the patient the option about increasing his insulin to 45 units twice a day versus resuming jardiance.  Only issue is he had elevated triglycerides previously on that medication.  Therefore the patient elects to check his triglycerides first prior to making a decision between the 2 options.  Await the report on the CT scan  06/06/17 Here for follow up.  CT was negative for PE.  SInce I last saw him, he is having episodic ruq pain  radiating into his right flank.  Pain comes and goes at will.  Denies fever, jaundice, or constant pain.  Denies melena or hematochezia.  Denies cough or pleurisy.  At that time, my plan was: Suspect biliary colic.  Hold metformin, lipitor, and fenofibrate.  Check cbc, cmp, lipase.  Use oxycodone for pain.  GO to er if pain is unrelenting or triad of symptoms develop.  Meanwhile, schedule ruq [place]. Asap.  06/21/17 Right upper quadrant ultrasound showed no gallstones, no gallbladder wall thickening, no pericholecystic fluid however it did show layering gallbladder sludge.  Patient has a family history of biliary dyskinesia in his father and gallstones in his mother.  Both of his parents and his brother had a gallbladder removed.  He continues to have episodes of right upper quadrant pain exactly where the gallbladder is located that radiate directly below his shoulder blade usually 1 hour after meals.  His symptoms sound classic for biliary disease.  He denies any melena or hematochezia or heartburn.  Unfortunately, he is not received an appointment yet with a general surgeon.  I am not certain of the cause of the delay    Past Medical History:  Diagnosis Date  . Diabetes mellitus without complication ([place])   . Hyperlipidemia   . Hypertension   . Wandering (atrial) pacemaker     Current Outpatient Medications on File Prior to Visit  Medication Sig Dispense Refill  . aspirin 81 MG tablet Take 81 mg by mouth daily.    Marland Kitchen atorvastatin (LIPITOR) 10 MG tablet Take 1 tablet (10 mg total) by mouth daily. 90 tablet 3  . empagliflozin (JARDIANCE) 25 MG TABS tablet Take 25 mg by mouth daily. 90 tablet 0  . fenofibrate 160 MG tablet Take 1 tablet (160 mg total) by mouth daily. 30 tablet 3  . glucose blood test strip Check BS bid - tid 100 each 12  . Insulin Glargine (BASAGLAR KWIKPEN) 100 UNIT/ML SOPN INJECT 40 UNITS UNDER THE SKIN TWICE A DAY 15 pen 3  . Insulin Pen Needle (PEN NEEDLES 3/16") 31G X 5 MM  MISC Use with Victoza and Basaglar pens 300 each 3  . liraglutide (VICTOZA) 18 MG/3ML SOPN INJECT 1.8 MGS SUBCUTANEOUSLY EVERY DAY 9 pen 5  . metFORMIN (GLUCOPHAGE) 1000 MG tablet TAKE 1 TABLET TWICE A DAY WITH A MEAL 180 tablet 3  . Omega-3 Fatty Acids (FISH OIL) 1200 MG CAPS Take by mouth.    . valACYclovir (VALTREX) 500 MG tablet TAKE 1 TABLET BY MOUTH TWICE A DAY 30 tablet 6   No current facility-administered medications on file prior to visit.    No Known Allergies Social History   Socioeconomic History  . Marital status: Married    Spouse name: Not on file  . Number of children: Not on file  . Years of education: Not on file  . Highest education level: Not on file  Occupational History  . Not on file  Social Needs  . Financial resource strain: Not on file  . Food insecurity:    Worry: Not on file    Inability: Not on file  . Transportation needs:    Medical: Not on file    Non-medical: Not on file  Tobacco Use  . Smoking status: Never Smoker  . Smokeless tobacco: Never Used  Substance and Sexual Activity  . Alcohol use: Yes    Comment: Rare  . Drug use: No  . Sexual activity: Not on file  Lifestyle  . Physical activity:    Days per week: Not on file    Minutes per session: Not on file  . Stress: Not on file  Relationships  . Social connections:    Talks on phone: Not on file    Gets together: Not on file    Attends religious service: Not on file    Active member of club or organization: Not on file    Attends meetings of clubs or organizations: Not on file    Relationship status: Not on file  . Intimate partner violence:    Fear of current or ex partner: Not on file    Emotionally abused: Not on file    Physically abused: Not on file    Forced sexual activity:  Not on file  Other Topics Concern  . Not on file  Social History Narrative  . Not on file   No family history on file.    Review of Systems  All other systems reviewed and are negative.       Objective:   Physical Exam  Constitutional: He appears well-developed and well-nourished. No distress.  Cardiovascular: Normal rate, regular rhythm, normal heart sounds and intact distal pulses. Exam reveals no gallop and no friction rub.  No murmur heard. Pulmonary/Chest: Effort normal and breath sounds normal. No respiratory distress. He has no wheezes. He has no rales. He exhibits no tenderness.  Musculoskeletal: He exhibits no edema.  Skin: He is not diaphoretic.  Vitals reviewed.         Assessment & Plan:  Biliary colic - Plan: oxyCODONE (ROXICODONE) 5 MG immediate release tablet I have my staff call the surgery office but we were unable to reach the referrals desk.  We will continue to call them throughout the day and try to get an appointment made for this patient as soon as possible.  I apologize for the delay and I did refill his pain medication.  At the present time there is no evidence of an emergency.  He denies any fevers, jaundice, or intractable pain.

## 2017-06-23 DIAGNOSIS — R1011 Right upper quadrant pain: Secondary | ICD-10-CM | POA: Diagnosis not present

## 2017-06-26 ENCOUNTER — Other Ambulatory Visit (HOSPITAL_COMMUNITY): Payer: Self-pay | Admitting: General Surgery

## 2017-06-26 DIAGNOSIS — R1011 Right upper quadrant pain: Secondary | ICD-10-CM

## 2017-06-30 ENCOUNTER — Ambulatory Visit (HOSPITAL_COMMUNITY): Payer: 59

## 2017-07-05 ENCOUNTER — Ambulatory Visit (HOSPITAL_COMMUNITY)
Admission: RE | Admit: 2017-07-05 | Discharge: 2017-07-05 | Disposition: A | Discharge status: Home / Self Care | Payer: 59 | Source: Ambulatory Visit | Attending: General Surgery | Admitting: General Surgery

## 2017-07-05 DIAGNOSIS — R932 Abnormal findings on diagnostic imaging of liver and biliary tract: Secondary | ICD-10-CM | POA: Insufficient documentation

## 2017-07-05 DIAGNOSIS — R1011 Right upper quadrant pain: Secondary | ICD-10-CM | POA: Diagnosis not present

## 2017-07-05 MED ORDER — TECHNETIUM TC 99M MEBROFENIN IV KIT
5.3000 | PACK | Freq: Once | INTRAVENOUS | Status: AC | PRN
  Administered 2017-07-05: 5.3 via INTRAVENOUS

## 2017-07-20 DIAGNOSIS — R1011 Right upper quadrant pain: Secondary | ICD-10-CM | POA: Diagnosis not present

## 2017-09-18 ENCOUNTER — Encounter: Payer: Self-pay | Admitting: Family Medicine

## 2017-09-19 ENCOUNTER — Other Ambulatory Visit: Payer: 59

## 2017-09-19 DIAGNOSIS — E119 Type 2 diabetes mellitus without complications: Secondary | ICD-10-CM

## 2017-09-19 DIAGNOSIS — E781 Pure hyperglyceridemia: Secondary | ICD-10-CM

## 2017-09-20 LAB — CBC WITH DIFFERENTIAL/PLATELET
Basophils Absolute: 60 cells/uL (ref 0–200)
Basophils Relative: 0.9 %
EOS ABS: 147 {cells}/uL (ref 15–500)
Eosinophils Relative: 2.2 %
HEMATOCRIT: 49.1 % (ref 38.5–50.0)
Hemoglobin: 16.3 g/dL (ref 13.2–17.1)
LYMPHS ABS: 2298 {cells}/uL (ref 850–3900)
MCH: 30.1 pg (ref 27.0–33.0)
MCHC: 33.2 g/dL (ref 32.0–36.0)
MCV: 90.8 fL (ref 80.0–100.0)
MPV: 10.6 fL (ref 7.5–12.5)
Monocytes Relative: 8.8 %
NEUTROS PCT: 53.8 %
Neutro Abs: 3605 cells/uL (ref 1500–7800)
Platelets: 229 10*3/uL (ref 140–400)
RBC: 5.41 10*6/uL (ref 4.20–5.80)
RDW: 12.8 % (ref 11.0–15.0)
Total Lymphocyte: 34.3 %
WBC: 6.7 10*3/uL (ref 3.8–10.8)
WBCMIX: 590 {cells}/uL (ref 200–950)

## 2017-09-20 LAB — COMPREHENSIVE METABOLIC PANEL
AG Ratio: 2 (calc) (ref 1.0–2.5)
ALBUMIN MSPROF: 4.4 g/dL (ref 3.6–5.1)
ALKALINE PHOSPHATASE (APISO): 57 U/L (ref 40–115)
ALT: 22 U/L (ref 9–46)
AST: 17 U/L (ref 10–35)
BUN / CREAT RATIO: 16 (calc) (ref 6–22)
BUN: 11 mg/dL (ref 7–25)
CO2: 25 mmol/L (ref 20–32)
CREATININE: 0.69 mg/dL — AB (ref 0.70–1.33)
Calcium: 9.6 mg/dL (ref 8.6–10.3)
Chloride: 104 mmol/L (ref 98–110)
GLOBULIN: 2.2 g/dL (ref 1.9–3.7)
GLUCOSE: 207 mg/dL — AB (ref 65–99)
POTASSIUM: 4.6 mmol/L (ref 3.5–5.3)
SODIUM: 140 mmol/L (ref 135–146)
Total Bilirubin: 0.6 mg/dL (ref 0.2–1.2)
Total Protein: 6.6 g/dL (ref 6.1–8.1)

## 2017-09-20 LAB — HEMOGLOBIN A1C
Hgb A1c MFr Bld: 8 % of total Hgb — ABNORMAL HIGH (ref <5.7)
Mean Plasma Glucose: 183 (calc)
eAG (mmol/L): 10.1 (calc)

## 2017-09-20 LAB — LIPID PANEL
CHOL/HDL RATIO: 7.7 (calc) — AB (ref <5.0)
Cholesterol: 170 mg/dL (ref <200)
HDL: 22 mg/dL — ABNORMAL LOW (ref >40)
LDL Cholesterol (Calc): 103 mg/dL (calc) — ABNORMAL HIGH
NON-HDL CHOLESTEROL (CALC): 148 mg/dL — AB (ref <130)
Triglycerides: 400 mg/dL — ABNORMAL HIGH (ref <150)

## 2017-09-20 LAB — MICROALBUMIN, URINE: MICROALB UR: 2 mg/dL

## 2017-10-03 ENCOUNTER — Ambulatory Visit: Payer: 59 | Admitting: Family Medicine

## 2017-10-03 ENCOUNTER — Encounter: Payer: Self-pay | Admitting: Family Medicine

## 2017-10-03 VITALS — BP 128/70 | HR 84 | Temp 97.8°F | Resp 16 | Ht 71.0 in | Wt 271.0 lb

## 2017-10-03 DIAGNOSIS — E119 Type 2 diabetes mellitus without complications: Secondary | ICD-10-CM | POA: Diagnosis not present

## 2017-10-03 DIAGNOSIS — R7989 Other specified abnormal findings of blood chemistry: Secondary | ICD-10-CM | POA: Diagnosis not present

## 2017-10-03 DIAGNOSIS — E781 Pure hyperglyceridemia: Secondary | ICD-10-CM

## 2017-10-03 MED ORDER — FENOFIBRATE 160 MG PO TABS: 160.0000 mg | ORAL_TABLET | Freq: Every day | ORAL | 3 refills | Status: DC

## 2017-10-03 MED ORDER — PIOGLITAZONE HCL 30 MG PO TABS: 30.0000 mg | ORAL_TABLET | Freq: Every day | ORAL | 5 refills | Status: DC

## 2017-10-03 NOTE — Progress Notes (Signed)
Subjective:    Patient ID: Isaiah Beasley, male    DOB: June 14, 1965, 52 y.o.   MRN: [id number]  Medication Refill     01/26/17 Patient is here today for a complete physical exam.  Diabetic foot exam is performed today and is normal.  He is listed to be due for a diabetic eye exam but he just had his eyes checked 3 weeks ago.  We have not received communication from that Dr.  He is due for prostate cancer screening.  His most recent PSA was excellent.  He is due for a colonoscopy and he finally agrees to allow me to schedule this.  He is also due for HIV and hepatitis C screening but he continues to defer this. Triglycerides are almost 900 despite being on fenofibrate.  Patient continues to exercise every day but is not restricting his diet.  His weight continues to gradually increase and he is taking jardiance.  He is finally willing to try a statin despite his family history of myalgias.  His hemoglobin A1c is also elevated at 7.6.  He denies any polyuria, polydipsia, or blurry vision.  He denies any chest pain shortness of breath or dyspnea on exertion.  He occasionally gets some burning in his feet but it is mild.  He denies any numbness.  At that time, my plan was: His blood pressure today is excellent.  We will discontinue Jardiance due to his elevated triglycerides.  We will replace it with Actos 30 mg a day.  We did discuss the risk of gaining weight and also bladder cancer and he is willing to accept that.  We also discussed the drastic need for lifestyle changes.  He needs to switch to a vegetarian diet.  He needs to lose 30 pounds.  He needs to exercise 30 minutes every day 5 days a week.  I would also like to add Lipitor 10 mg a day.  He will gradually increase the dose and the frequency of this medication as tolerated.  Recheck labs in 3 months.  I will schedule the patient for a colonoscopy.  His diabetic eye exam and diabetic foot exam are now up-to-date.  He received his flu shot.  He  declines HIV and hepatitis C screening.  03/17/17 Patient is here today for follow-up. He is tolerating Lipitor and fenofibrate without any myalgias or right upper quadrant pain. We do want to recheck his fasting lipid panel after 6 weeks to ensure that his triglycerides are falling off a combination of Giardia answer and on the change in medication as well as evaluate his liver function tests for any evidence of drug-induced hepatitis. Unfortunately, since starting Actos, the patient has gained almost 14 pounds. This is unacceptable for him and I agree. He wants to discontinue Actos. However his options now are extremely limited. We had a long discussion and he is willing to try insulin.  At that time, my plan was: Discontinue Actos and replaced with Lantus 10 units subcutaneous daily. Patient will check his fasting blood sugar every morning and increase Lantus by 1 unit each day until his fasting blood sugars fall below 130. Once his fasting blood sugars are less than 130 he will maintain that dosage of Lantus. He will continue metformin and Victoza for the present time and call me in one month with his sugars so that we can make an adjustment in his insulin if necessary. Patient is comfortable with this plan. I will recheck a fasting lipid panel today  to monitor his liver function tests and his triglyceride levels off the Jardiance and on the Lipitor combined fenofibrate  04/18/17 I asked the patient back because his sugar levels are till elevated despite being on high dose basal insulin.  Patient is currently taking 66 units of basaglar.  Fasting blood sugars average between 140 and 160. 2 hour postprandial sugars average between 180 and 240.  He is here today to discuss options to improve his glycemic control.  At that time, my plan was:  We discussed options including increasing basal insulin versus adding mealtime insulin versus consult endocrinology. Ultimately we decided to increase his basal  insulin in split it to twice daily. He will take 40 units twice daily. He will continue to record his sugars and report to me in one to 2 weeks. If we're not seeing improvement at that point, I would suggest adding mealtime insulin or consult endocrinology  05/05/17 Here for follow up.  Recently got back from [facility].  After the plane flight, he noticed swelling in both legs.  Earlier this week, he developed pleuritic pain in his right lung.  Pain is located below his right nipple near the gallbladder and radiates to his right shoulder blade.  It is not related to food.  He does report some mild shortness of breath.  He denies any fevers or chills.  He denies any coughing.  His blood sugars are typically between 100-200.  Sugars in the morning and between 101 150.  2-hour postprandial sugars are between 150 and 230.  At that time, my plan was: Given his history, I am concerned about a pulmonary embolism.  Begin Xarelto 15 mg p.o. twice daily and send the patient immediately for a CT scan of the chest to evaluate further.  He will take the first dose of Xarelto now.  If the CT scan is negative for pulmonary embolism, we will discontinue the Xarelto.  If it is positive, I will recheck the patient on Monday and if symptoms worsen he is instructed to go the emergency room.  If the CT scan is negative, consider muscle strain as potential cause for the chest wall pain.  Also consider gallbladder given its location.  Blood sugars are much better but are still slightly elevated.  I gave the patient the option about increasing his insulin to 45 units twice a day versus resuming jardiance.  Only issue is he had elevated triglycerides previously on that medication.  Therefore the patient elects to check his triglycerides first prior to making a decision between the 2 options.  Await the report on the CT scan  06/06/17 Here for follow up.  CT was negative for PE.  SInce I last saw him, he is having episodic ruq pain  radiating into his right flank.  Pain comes and goes at will.  Denies fever, jaundice, or constant pain.  Denies melena or hematochezia.  Denies cough or pleurisy.  At that time, my plan was: Suspect biliary colic.  Hold metformin, lipitor, and fenofibrate.  Check cbc, cmp, lipase.  Use oxycodone for pain.  GO to er if pain is unrelenting or triad of symptoms develop.  Meanwhile, schedule ruq [place]. Asap.  06/21/17 Right upper quadrant ultrasound showed no gallstones, no gallbladder wall thickening, no pericholecystic fluid however it did show layering gallbladder sludge.  Patient has a family history of biliary dyskinesia in his father and gallstones in his mother.  Both of his parents and his brother had a gallbladder removed.  He continues to have episodes of right upper quadrant pain exactly where the gallbladder is located that radiate directly below his shoulder blade usually 1 hour after meals.  His symptoms sound classic for biliary disease.  He denies any melena or hematochezia or heartburn.  Unfortunately, he is not received an appointment yet with a general surgeon.  I am not certain of the cause of the delay.  At that time, my plan was: I have my staff call the surgery office but we were unable to reach the referrals desk.  We will continue to call them throughout the day and try to get an appointment made for this patient as soon as possible.  I apologize for the delay and I did refill his pain medication.  At the present time there is no evidence of an emergency.  He denies any fevers, jaundice, or intractable pain.  10/03/17  Patient ultimately had a HIDA scan which revealed findings consistent with biliary dyskinesia however he attributes the right upper quadrant abdominal pain to his insulin.  On 3 separate occasions he has discontinued insulin.  On each of those occasions the pain completely subsided.  In each occasion shortly after resuming insulin, the pain would return.  During this last  period of time, he has been off his insulin now for more than a month and a half and has not had any further right upper quadrant pain for a month and a half.  Therefore he is convinced that the insulin was playing some kind of role in his biliary dyskinesia.  His most recent lab work is listed below: Lab on 09/19/2017  Component Date Value Ref Range Status  . Hgb A1c MFr Bld 09/19/2017 8.0* <5.7 % of total Hgb Final   Comment: For someone without known diabetes, a hemoglobin A1c value of 6.5% or greater indicates that they may have  diabetes and this should be confirmed with a follow-up  test. . For someone with known diabetes, a value <7% indicates  that their diabetes is well controlled and a value  greater than or equal to 7% indicates suboptimal  control. A1c targets should be individualized based on  duration of diabetes, age, comorbid conditions, and  other considerations. . Currently, no consensus exists regarding use of hemoglobin A1c for diagnosis of diabetes for children. .   . Mean Plasma Glucose 09/19/2017 183  (calc) Final  . eAG (mmol/L) 09/19/2017 10.1  (calc) Final  . Microalb, Ur 09/19/2017 2.0  mg/dL Final   Comment: Reference Range Not established   . RAM 09/19/2017    Final   Comment: . The ADA defines abnormalities in albumin excretion as follows: Marland Kitchen Category         Result (mcg/mg creatinine) . Normal                    <30 Microalbuminuria         30-299  Clinical albuminuria   > OR = 300 . The ADA recommends that at least two of three specimens collected within a 3-6 month period be abnormal before considering a patient to be within a diagnostic category.   . Cholesterol 09/19/2017 170  <200 mg/dL Final  . HDL 09/19/2017 22* >40 mg/dL Final  . Triglycerides 09/19/2017 400* <150 mg/dL Final  . LDL Cholesterol (Calc) 09/19/2017 103* mg/dL (calc) Final   Comment: Reference range: <100 . Desirable range <100 mg/dL for primary prevention;   <70  mg/dL for patients with CHD or  diabetic patients  with > or = 2 CHD risk factors. Marland Kitchen LDL-C is now calculated using the Martin-Hopkins  calculation, which is a validated novel method providing  better accuracy than the Friedewald equation in the  estimation of LDL-C.  Cresenciano Genre et al. Annamaria Helling. 8850;277(41): 2061-2068  (http://education.[facility].com/faq/FAQ164)   . Total CHOL/HDL Ratio 09/19/2017 7.7* <5.0 (calc) Final  . Non-HDL Cholesterol (Calc) 09/19/2017 148* <130 mg/dL (calc) Final   Comment: For patients with diabetes plus 1 major ASCVD risk  factor, treating to a non-HDL-C goal of <100 mg/dL  (LDL-C of <70 mg/dL) is considered a therapeutic  option.   . Glucose, Bld 09/19/2017 207* 65 - 99 mg/dL Final   Comment: .            Fasting reference interval . For someone without known diabetes, a glucose value >125 mg/dL indicates that they may have diabetes and this should be confirmed with a follow-up test. .   . BUN 09/19/2017 11  7 - 25 mg/dL Final  . Creat 09/19/2017 0.69* 0.70 - 1.33 mg/dL Final   Comment: For patients >66 years of age, the reference limit for Creatinine is approximately 13% higher for people identified as African-American. .   Havery Moros Ratio 09/19/2017 16  6 - 22 (calc) Final  . Sodium 09/19/2017 140  135 - 146 mmol/L Final  . Potassium 09/19/2017 4.6  3.5 - 5.3 mmol/L Final  . Chloride 09/19/2017 104  98 - 110 mmol/L Final  . CO2 09/19/2017 25  20 - 32 mmol/L Final  . Calcium 09/19/2017 9.6  8.6 - 10.3 mg/dL Final  . Total Protein 09/19/2017 6.6  6.1 - 8.1 g/dL Final  . Albumin 09/19/2017 4.4  3.6 - 5.1 g/dL Final  . Globulin 09/19/2017 2.2  1.9 - 3.7 g/dL (calc) Final  . AG Ratio 09/19/2017 2.0  1.0 - 2.5 (calc) Final  . Total Bilirubin 09/19/2017 0.6  0.2 - 1.2 mg/dL Final  . Alkaline phosphatase (APISO) 09/19/2017 57  40 - 115 U/L Final  . AST 09/19/2017 17  10 - 35 U/L Final  . ALT 09/19/2017 22  9 - 46 U/L Final  . WBC  09/19/2017 6.7  3.8 - 10.8 Thousand/uL Final  . RBC 09/19/2017 5.41  4.20 - 5.80 Million/uL Final  . Hemoglobin 09/19/2017 16.3  13.2 - 17.1 g/dL Final  . HCT 09/19/2017 49.1  38.5 - 50.0 % Final  . MCV 09/19/2017 90.8  80.0 - 100.0 fL Final  . MCH 09/19/2017 30.1  27.0 - 33.0 pg Final  . MCHC 09/19/2017 33.2  32.0 - 36.0 g/dL Final  . RDW 09/19/2017 12.8  11.0 - 15.0 % Final  . Platelets 09/19/2017 229  140 - 400 Thousand/uL Final  . MPV 09/19/2017 10.6  7.5 - 12.5 fL Final  . Neutro Abs 09/19/2017 3,605  1,500 - 7,800 cells/uL Final  . Lymphs Abs 09/19/2017 2,298  850 - 3,900 cells/uL Final  . WBC mixed population 09/19/2017 590  200 - 950 cells/uL Final  . Eosinophils Absolute 09/19/2017 147  15 - 500 cells/uL Final  . Basophils Absolute 09/19/2017 60  0 - 200 cells/uL Final  . Neutrophils Relative % 09/19/2017 53.8  % Final  . Total Lymphocyte 09/19/2017 34.3  % Final  . Monocytes Relative 09/19/2017 8.8  % Final  . Eosinophils Relative 09/19/2017 2.2  % Final  . Basophils Relative 09/19/2017 0.9  % Final   Patient denies any polyuria, polydipsia, or blurry vision.  He denies any chest pain shortness of breath or dyspnea on exertion.  He is elected not to treat his hypogonadism despite his metabolic syndrome.  While taking testosterone replacement, the patient had anger control issues and therefore has elected not to use testosterone.  He is only lost 2 pounds since his last office visit.  He is exercising 4 to 5 days a week however he admits that he is eating more than 3000 cal a day   Past Medical History:  Diagnosis Date  . Diabetes mellitus without complication ([place])   . Hyperlipidemia   . Hypertension   . Wandering (atrial) pacemaker     Current Outpatient Medications on File Prior to Visit  Medication Sig Dispense Refill  . aspirin 81 MG tablet Take 81 mg by mouth daily.    Marland Kitchen atorvastatin (LIPITOR) 10 MG tablet Take 1 tablet (10 mg total) by mouth daily. 90 tablet 3  .  empagliflozin (JARDIANCE) 25 MG TABS tablet Take 25 mg by mouth daily. 90 tablet 0  . fenofibrate 160 MG tablet Take 1 tablet (160 mg total) by mouth daily. 30 tablet 3  . glucose blood test strip Check BS bid - tid 100 each 12  . Insulin Glargine (BASAGLAR KWIKPEN) 100 UNIT/ML SOPN INJECT 40 UNITS UNDER THE SKIN TWICE A DAY 15 pen 3  . Insulin Pen Needle (PEN NEEDLES 3/16") 31G X 5 MM MISC Use with Victoza and Basaglar pens 300 each 3  . liraglutide (VICTOZA) 18 MG/3ML SOPN INJECT 1.8 MGS SUBCUTANEOUSLY EVERY DAY 9 pen 5  . metFORMIN (GLUCOPHAGE) 1000 MG tablet TAKE 1 TABLET TWICE A DAY WITH A MEAL 180 tablet 3  . Omega-3 Fatty Acids (FISH OIL) 1200 MG CAPS Take by mouth.    . oxyCODONE (ROXICODONE) 5 MG immediate release tablet Take 1 tablet (5 mg total) by mouth every 4 (four) hours as needed for severe pain. 30 tablet 0  . valACYclovir (VALTREX) 500 MG tablet TAKE 1 TABLET BY MOUTH TWICE A DAY 30 tablet 6   No current facility-administered medications on file prior to visit.    No Known Allergies Social History   Socioeconomic History  . Marital status: Married    Spouse name: Not on file  . Number of children: Not on file  . Years of education: Not on file  . Highest education level: Not on file  Occupational History  . Not on file  Social Needs  . Financial resource strain: Not on file  . Food insecurity:    Worry: Not on file    Inability: Not on file  . Transportation needs:    Medical: Not on file    Non-medical: Not on file  Tobacco Use  . Smoking status: Never Smoker  . Smokeless tobacco: Never Used  Substance and Sexual Activity  . Alcohol use: Yes    Comment: Rare  . Drug use: No  . Sexual activity: Not on file  Lifestyle  . Physical activity:    Days per week: Not on file    Minutes per session: Not on file  . Stress: Not on file  Relationships  . Social connections:    Talks on phone: Not on file    Gets together: Not on file    Attends religious  service: Not on file    Active member of club or organization: Not on file    Attends meetings of clubs or organizations: Not on file    Relationship status: Not on file  .  Intimate partner violence:    Fear of current or ex partner: Not on file    Emotionally abused: Not on file    Physically abused: Not on file    Forced sexual activity: Not on file  Other Topics Concern  . Not on file  Social History Narrative  . Not on file   No family history on file.    Review of Systems  All other systems reviewed and are negative.      Objective:   Physical Exam  Constitutional: He appears well-developed and well-nourished. No distress.  Cardiovascular: Normal rate, regular rhythm, normal heart sounds and intact distal pulses. Exam reveals no gallop and no friction rub.  No murmur heard. Pulmonary/Chest: Effort normal and breath sounds normal. No respiratory distress. He has no wheezes. He has no rales. He exhibits no tenderness.  Musculoskeletal: He exhibits no edema.  Skin: He is not diaphoretic.  Vitals reviewed.         Assessment & Plan:   Diabetes mellitus without complication ([place]) - Plan: COMPLETE METABOLIC PANEL WITH GFR, Lipid panel, Hemoglobin A1c  Hypertriglyceridemia  Low testosterone - Plan: Testosterone, CBC with Differential/Platelet, PSA  Add Actos 30 mg a day and recheck hemoglobin A1c in 3 months.  Otherwise continue his current medications.  Recommended decreasing his calorie intake to 1800 to 1500 cal/day and continuing to exercise 30 minutes a day 5 days a week.  Ideally I like to see the patient gradually lose 15 to 30 pounds to help control his diabetes and his dyslipidemia.  Blood pressure is acceptable.  Patient is elected not to treat hypogonadism.  I will recheck a testosterone level and a PSA at his next visit.

## 2017-10-30 ENCOUNTER — Other Ambulatory Visit: Payer: Self-pay | Admitting: Family Medicine

## 2017-11-30 DIAGNOSIS — R05 Cough: Secondary | ICD-10-CM | POA: Diagnosis not present

## 2017-11-30 DIAGNOSIS — J189 Pneumonia, unspecified organism: Secondary | ICD-10-CM | POA: Diagnosis not present

## 2017-12-04 ENCOUNTER — Encounter: Payer: Self-pay | Admitting: Family Medicine

## 2018-01-05 DIAGNOSIS — Z23 Encounter for immunization: Secondary | ICD-10-CM | POA: Diagnosis not present

## 2018-01-06 ENCOUNTER — Encounter: Payer: Self-pay | Admitting: Family Medicine

## 2018-01-08 MED ORDER — BASAGLAR KWIKPEN 100 UNIT/ML ~~LOC~~ SOPN: PEN_INJECTOR | SUBCUTANEOUS | 3 refills | Status: DC

## 2018-01-17 ENCOUNTER — Other Ambulatory Visit: Payer: 59

## 2018-01-17 DIAGNOSIS — R7989 Other specified abnormal findings of blood chemistry: Secondary | ICD-10-CM | POA: Diagnosis not present

## 2018-01-17 DIAGNOSIS — Z125 Encounter for screening for malignant neoplasm of prostate: Secondary | ICD-10-CM

## 2018-01-17 DIAGNOSIS — E119 Type 2 diabetes mellitus without complications: Secondary | ICD-10-CM

## 2018-01-17 DIAGNOSIS — E781 Pure hyperglyceridemia: Secondary | ICD-10-CM

## 2018-01-18 LAB — LIPID PANEL
Cholesterol: 175 mg/dL (ref <200)
HDL: 22 mg/dL — ABNORMAL LOW (ref >40)
LDL Cholesterol (Calc): 110 mg/dL (calc) — ABNORMAL HIGH
NON-HDL CHOLESTEROL (CALC): 153 mg/dL — AB (ref <130)
Total CHOL/HDL Ratio: 8 (calc) — ABNORMAL HIGH (ref <5.0)
Triglycerides: 320 mg/dL — ABNORMAL HIGH (ref <150)

## 2018-01-18 LAB — CBC WITH DIFFERENTIAL/PLATELET
BASOS PCT: 0.9 %
Basophils Absolute: 68 cells/uL (ref 0–200)
EOS ABS: 137 {cells}/uL (ref 15–500)
EOS PCT: 1.8 %
HCT: 49 % (ref 38.5–50.0)
HEMOGLOBIN: 16.4 g/dL (ref 13.2–17.1)
LYMPHS ABS: 3116 {cells}/uL (ref 850–3900)
MCH: 29.9 pg (ref 27.0–33.0)
MCHC: 33.5 g/dL (ref 32.0–36.0)
MCV: 89.4 fL (ref 80.0–100.0)
MONOS PCT: 9.1 %
MPV: 10.6 fL (ref 7.5–12.5)
NEUTROS ABS: 3587 {cells}/uL (ref 1500–7800)
Neutrophils Relative %: 47.2 %
Platelets: 266 10*3/uL (ref 140–400)
RBC: 5.48 10*6/uL (ref 4.20–5.80)
RDW: 12.7 % (ref 11.0–15.0)
Total Lymphocyte: 41 %
WBC mixed population: 692 cells/uL (ref 200–950)
WBC: 7.6 10*3/uL (ref 3.8–10.8)

## 2018-01-18 LAB — COMPREHENSIVE METABOLIC PANEL
AG RATIO: 2 (calc) (ref 1.0–2.5)
ALBUMIN MSPROF: 4.4 g/dL (ref 3.6–5.1)
ALT: 22 U/L (ref 9–46)
AST: 17 U/L (ref 10–35)
Alkaline phosphatase (APISO): 49 U/L (ref 40–115)
BUN / CREAT RATIO: 19 (calc) (ref 6–22)
BUN: 11 mg/dL (ref 7–25)
CHLORIDE: 105 mmol/L (ref 98–110)
CO2: 24 mmol/L (ref 20–32)
CREATININE: 0.58 mg/dL — AB (ref 0.70–1.33)
Calcium: 9.3 mg/dL (ref 8.6–10.3)
GLOBULIN: 2.2 g/dL (ref 1.9–3.7)
Glucose, Bld: 140 mg/dL — ABNORMAL HIGH (ref 65–99)
POTASSIUM: 4.2 mmol/L (ref 3.5–5.3)
SODIUM: 142 mmol/L (ref 135–146)
TOTAL PROTEIN: 6.6 g/dL (ref 6.1–8.1)
Total Bilirubin: 0.4 mg/dL (ref 0.2–1.2)

## 2018-01-18 LAB — HEMOGLOBIN A1C
EAG (MMOL/L): 9.8 (calc)
Hgb A1c MFr Bld: 7.8 % of total Hgb — ABNORMAL HIGH (ref <5.7)
MEAN PLASMA GLUCOSE: 177 (calc)

## 2018-01-18 LAB — TESTOSTERONE: Testosterone: 127 ng/dL — ABNORMAL LOW (ref 250–827)

## 2018-01-18 LAB — PSA: PSA: 0.3 ng/mL (ref <=4.0)

## 2018-01-25 ENCOUNTER — Ambulatory Visit: Payer: 59 | Admitting: Family Medicine

## 2018-01-25 ENCOUNTER — Encounter: Payer: Self-pay | Admitting: Family Medicine

## 2018-01-25 ENCOUNTER — Other Ambulatory Visit: Payer: Self-pay

## 2018-01-25 VITALS — BP 136/68 | HR 97 | Wt 276.0 lb

## 2018-01-25 DIAGNOSIS — E119 Type 2 diabetes mellitus without complications: Secondary | ICD-10-CM

## 2018-01-25 DIAGNOSIS — E781 Pure hyperglyceridemia: Secondary | ICD-10-CM

## 2018-01-25 DIAGNOSIS — R7989 Other specified abnormal findings of blood chemistry: Secondary | ICD-10-CM | POA: Diagnosis not present

## 2018-01-25 NOTE — Progress Notes (Signed)
Subjective:    Patient ID: Isaiah Beasley, male    DOB: 27-Sep-1965, 52 y.o.   MRN: [id number]  Medication Refill     01/26/17 Patient is here today for a complete physical exam.  Diabetic foot exam is performed today and is normal.  He is listed to be due for a diabetic eye exam but he just had his eyes checked 3 weeks ago.  We have not received communication from that Dr.  He is due for prostate cancer screening.  His most recent PSA was excellent.  He is due for a colonoscopy and he finally agrees to allow me to schedule this.  He is also due for HIV and hepatitis C screening but he continues to defer this. Triglycerides are almost 900 despite being on fenofibrate.  Patient continues to exercise every day but is not restricting his diet.  His weight continues to gradually increase and he is taking jardiance.  He is finally willing to try a statin despite his family history of myalgias.  His hemoglobin A1c is also elevated at 7.6.  He denies any polyuria, polydipsia, or blurry vision.  He denies any chest pain shortness of breath or dyspnea on exertion.  He occasionally gets some burning in his feet but it is mild.  He denies any numbness.  At that time, my plan was: His blood pressure today is excellent.  We will discontinue Jardiance due to his elevated triglycerides.  We will replace it with Actos 30 mg a day.  We did discuss the risk of gaining weight and also bladder cancer and he is willing to accept that.  We also discussed the drastic need for lifestyle changes.  He needs to switch to a vegetarian diet.  He needs to lose 30 pounds.  He needs to exercise 30 minutes every day 5 days a week.  I would also like to add Lipitor 10 mg a day.  He will gradually increase the dose and the frequency of this medication as tolerated.  Recheck labs in 3 months.  I will schedule the patient for a colonoscopy.  His diabetic eye exam and diabetic foot exam are now up-to-date.  He received his flu shot.  He  declines HIV and hepatitis C screening.  03/17/17 Patient is here today for follow-up. He is tolerating Lipitor and fenofibrate without any myalgias or right upper quadrant pain. We do want to recheck his fasting lipid panel after 6 weeks to ensure that his triglycerides are falling off a combination of Giardia answer and on the change in medication as well as evaluate his liver function tests for any evidence of drug-induced hepatitis. Unfortunately, since starting Actos, the patient has gained almost 14 pounds. This is unacceptable for him and I agree. He wants to discontinue Actos. However his options now are extremely limited. We had a long discussion and he is willing to try insulin.  At that time, my plan was: Discontinue Actos and replaced with Lantus 10 units subcutaneous daily. Patient will check his fasting blood sugar every morning and increase Lantus by 1 unit each day until his fasting blood sugars fall below 130. Once his fasting blood sugars are less than 130 he will maintain that dosage of Lantus. He will continue metformin and Victoza for the present time and call me in one month with his sugars so that we can make an adjustment in his insulin if necessary. Patient is comfortable with this plan. I will recheck a fasting lipid panel today  to monitor his liver function tests and his triglyceride levels off the Jardiance and on the Lipitor combined fenofibrate  04/18/17 I asked the patient back because his sugar levels are till elevated despite being on high dose basal insulin.  Patient is currently taking 66 units of basaglar.  Fasting blood sugars average between 140 and 160. 2 hour postprandial sugars average between 180 and 240.  He is here today to discuss options to improve his glycemic control.  At that time, my plan was:  We discussed options including increasing basal insulin versus adding mealtime insulin versus consult endocrinology. Ultimately we decided to increase his basal  insulin in split it to twice daily. He will take 40 units twice daily. He will continue to record his sugars and report to me in one to 2 weeks. If we're not seeing improvement at that point, I would suggest adding mealtime insulin or consult endocrinology  05/05/17 Here for follow up.  Recently got back from [facility].  After the plane flight, he noticed swelling in both legs.  Earlier this week, he developed pleuritic pain in his right lung.  Pain is located below his right nipple near the gallbladder and radiates to his right shoulder blade.  It is not related to food.  He does report some mild shortness of breath.  He denies any fevers or chills.  He denies any coughing.  His blood sugars are typically between 100-200.  Sugars in the morning and between 101 150.  2-hour postprandial sugars are between 150 and 230.  At that time, my plan was: Given his history, I am concerned about a pulmonary embolism.  Begin Xarelto 15 mg p.o. twice daily and send the patient immediately for a CT scan of the chest to evaluate further.  He will take the first dose of Xarelto now.  If the CT scan is negative for pulmonary embolism, we will discontinue the Xarelto.  If it is positive, I will recheck the patient on Monday and if symptoms worsen he is instructed to go the emergency room.  If the CT scan is negative, consider muscle strain as potential cause for the chest wall pain.  Also consider gallbladder given its location.  Blood sugars are much better but are still slightly elevated.  I gave the patient the option about increasing his insulin to 45 units twice a day versus resuming jardiance.  Only issue is he had elevated triglycerides previously on that medication.  Therefore the patient elects to check his triglycerides first prior to making a decision between the 2 options.  Await the report on the CT scan  06/06/17 Here for follow up.  CT was negative for PE.  SInce I last saw him, he is having episodic ruq pain  radiating into his right flank.  Pain comes and goes at will.  Denies fever, jaundice, or constant pain.  Denies melena or hematochezia.  Denies cough or pleurisy.  At that time, my plan was: Suspect biliary colic.  Hold metformin, lipitor, and fenofibrate.  Check cbc, cmp, lipase.  Use oxycodone for pain.  GO to er if pain is unrelenting or triad of symptoms develop.  Meanwhile, schedule ruq [place]. Asap.  06/21/17 Right upper quadrant ultrasound showed no gallstones, no gallbladder wall thickening, no pericholecystic fluid however it did show layering gallbladder sludge.  Patient has a family history of biliary dyskinesia in his father and gallstones in his mother.  Both of his parents and his brother had a gallbladder removed.  He continues to have episodes of right upper quadrant pain exactly where the gallbladder is located that radiate directly below his shoulder blade usually 1 hour after meals.  His symptoms sound classic for biliary disease.  He denies any melena or hematochezia or heartburn.  Unfortunately, he is not received an appointment yet with a general surgeon.  I am not certain of the cause of the delay.  At that time, my plan was: I have my staff call the surgery office but we were unable to reach the referrals desk.  We will continue to call them throughout the day and try to get an appointment made for this patient as soon as possible.  I apologize for the delay and I did refill his pain medication.  At the present time there is no evidence of an emergency.  He denies any fevers, jaundice, or intractable pain.  10/03/17  Patient ultimately had a HIDA scan which revealed findings consistent with biliary dyskinesia however he attributes the right upper quadrant abdominal pain to his insulin.  On 3 separate occasions he has discontinued insulin.  On each of those occasions the pain completely subsided.  In each occasion shortly after resuming insulin, the pain would return.  During this last  period of time, he has been off his insulin now for more than a month and a half and has not had any further right upper quadrant pain for a month and a half.  Therefore he is convinced that the insulin was playing some kind of role in his biliary dyskinesia.  Patient denies any polyuria, polydipsia, or blurry vision.  He denies any chest pain shortness of breath or dyspnea on exertion.  He is elected not to treat his hypogonadism despite his metabolic syndrome.  While taking testosterone replacement, the patient had anger control issues and therefore has elected not to use testosterone.  He is only lost 2 pounds since his last office visit.  He is exercising 4 to 5 days a week however he admits that he is eating more than 3000 cal a day.  At that time, my plan was: Add Actos 30 mg a day and recheck hemoglobin A1c in 3 months.  Otherwise continue his current medications.  Recommended decreasing his calorie intake to 1800 to 1500 cal/day and continuing to exercise 30 minutes a day 5 days a week.  Ideally I like to see the patient gradually lose 15 to 30 pounds to help control his diabetes and his dyslipidemia.  Blood pressure is acceptable.  Patient is elected not to treat hypogonadism.  I will recheck a testosterone level and a PSA at his next visit.  01/25/18 No visits with results within 1 Week(s) from this visit.  Latest known visit with results is:  Lab on 01/17/2018  Component Date Value Ref Range Status  . Hgb A1c MFr Bld 01/17/2018 7.8* <5.7 % of total Hgb Final   Comment: For someone without known diabetes, a hemoglobin A1c value of 6.5% or greater indicates that they may have  diabetes and this should be confirmed with a follow-up  test. . For someone with known diabetes, a value <7% indicates  that their diabetes is well controlled and a value  greater than or equal to 7% indicates suboptimal  control. A1c targets should be individualized based on  duration of diabetes, age, comorbid  conditions, and  other considerations. . Currently, no consensus exists regarding use of hemoglobin A1c for diagnosis of diabetes for children. Marland Kitchen   Marland Kitchen  Mean Plasma Glucose 01/17/2018 177  (calc) Final  . eAG (mmol/L) 01/17/2018 9.8  (calc) Final  . PSA 01/17/2018 0.3  < OR = 4.0 ng/mL Final   Comment: The total PSA value from this assay system is  standardized against the WHO standard. The test  result will be approximately 20% lower when compared  to the equimolar-standardized total PSA (Beckman  Coulter). Comparison of serial PSA results should be  interpreted with this fact in mind. . This test was performed using the Siemens  chemiluminescent method. Values obtained from  different assay methods cannot be used interchangeably. PSA levels, regardless of value, should not be interpreted as absolute evidence of the presence or absence of disease.   . Cholesterol 01/17/2018 175  <200 mg/dL Final  . HDL 01/17/2018 22* >40 mg/dL Final  . Triglycerides 01/17/2018 320* <150 mg/dL Final   Comment: . If a non-fasting specimen was collected, consider repeat triglyceride testing on a fasting specimen if clinically indicated.  [name] et al. J. of Clin. Lipidol. 0973;5:329-924. .   . LDL Cholesterol (Calc) 01/17/2018 110* mg/dL (calc) Final   Comment: Reference range: <100 . Desirable range <100 mg/dL for primary prevention;   <70 mg/dL for patients with CHD or diabetic patients  with > or = 2 CHD risk factors. Marland Kitchen LDL-C is now calculated using the Martin-Hopkins  calculation, which is a validated novel method providing  better accuracy than the Friedewald equation in the  estimation of LDL-C.  Cresenciano Genre et al. Annamaria Helling. 2683;419(62): 2061-2068  (http://education.[facility].com/faq/FAQ164)   . Total CHOL/HDL Ratio 01/17/2018 8.0* <5.0 (calc) Final  . Non-HDL Cholesterol (Calc) 01/17/2018 153* <130 mg/dL (calc) Final   Comment: For patients with diabetes plus 1 major ASCVD risk   factor, treating to a non-HDL-C goal of <100 mg/dL  (LDL-C of <70 mg/dL) is considered a therapeutic  option.   . Glucose, Bld 01/17/2018 140* 65 - 99 mg/dL Final   Comment: .            Fasting reference interval . For someone without known diabetes, a glucose value >125 mg/dL indicates that they may have diabetes and this should be confirmed with a follow-up test. .   . BUN 01/17/2018 11  7 - 25 mg/dL Final  . Creat 01/17/2018 0.58* 0.70 - 1.33 mg/dL Final   Comment: For patients >13 years of age, the reference limit for Creatinine is approximately 13% higher for people identified as African-American. .   Havery Moros Ratio 01/17/2018 19  6 - 22 (calc) Final  . Sodium 01/17/2018 142  135 - 146 mmol/L Final  . Potassium 01/17/2018 4.2  3.5 - 5.3 mmol/L Final  . Chloride 01/17/2018 105  98 - 110 mmol/L Final  . CO2 01/17/2018 24  20 - 32 mmol/L Final  . Calcium 01/17/2018 9.3  8.6 - 10.3 mg/dL Final  . Total Protein 01/17/2018 6.6  6.1 - 8.1 g/dL Final  . Albumin 01/17/2018 4.4  3.6 - 5.1 g/dL Final  . Globulin 01/17/2018 2.2  1.9 - 3.7 g/dL (calc) Final  . AG Ratio 01/17/2018 2.0  1.0 - 2.5 (calc) Final  . Total Bilirubin 01/17/2018 0.4  0.2 - 1.2 mg/dL Final  . Alkaline phosphatase (APISO) 01/17/2018 49  40 - 115 U/L Final  . AST 01/17/2018 17  10 - 35 U/L Final  . ALT 01/17/2018 22  9 - 46 U/L Final  . WBC 01/17/2018 7.6  3.8 - 10.8 Thousand/uL Final  . RBC 01/17/2018  5.48  4.20 - 5.80 Million/uL Final  . Hemoglobin 01/17/2018 16.4  13.2 - 17.1 g/dL Final  . HCT 01/17/2018 49.0  38.5 - 50.0 % Final  . MCV 01/17/2018 89.4  80.0 - 100.0 fL Final  . MCH 01/17/2018 29.9  27.0 - 33.0 pg Final  . MCHC 01/17/2018 33.5  32.0 - 36.0 g/dL Final  . RDW 01/17/2018 12.7  11.0 - 15.0 % Final  . Platelets 01/17/2018 266  140 - 400 Thousand/uL Final  . MPV 01/17/2018 10.6  7.5 - 12.5 fL Final  . Neutro Abs 01/17/2018 3,587  1,500 - 7,800 cells/uL Final  . Lymphs Abs 01/17/2018  3,116  850 - 3,900 cells/uL Final  . WBC mixed population 01/17/2018 692  200 - 950 cells/uL Final  . Eosinophils Absolute 01/17/2018 137  15 - 500 cells/uL Final  . Basophils Absolute 01/17/2018 68  0 - 200 cells/uL Final  . Neutrophils Relative % 01/17/2018 47.2  % Final  . Total Lymphocyte 01/17/2018 41.0  % Final  . Monocytes Relative 01/17/2018 9.1  % Final  . Eosinophils Relative 01/17/2018 1.8  % Final  . Basophils Relative 01/17/2018 0.9  % Final  . Testosterone 01/17/2018 127* 250 - 827 ng/dL Final   Comment: In hypogonadal males, Testosterone, Total, LC/MS/MS, is the recommended assay due to the diminished accuracy of immunoassay at levels below 250 ng/dL. This test code [id number]) must be collected in a red-top tube with no gel.      He denies any chest pain shortness of breath or dyspnea on exertion.  He denies any myalgias or right upper quadrant pain.  He denies any nausea or vomiting.  He occasionally has some neuropathy in his feet when his blood sugars are out of control.  He denies any polyuria, polydipsia, or blurry vision.  He is gradually resumed his Lantus and his increase to 40 units twice a day.  His fasting blood sugars are between 130 and 160.  He discontinued his Actos.  Past Medical History:  Diagnosis Date  . Diabetes mellitus without complication ([place])   . Hyperlipidemia   . Hypertension   . Wandering (atrial) pacemaker     Current Outpatient Medications on File Prior to Visit  Medication Sig Dispense Refill  . aspirin 81 MG tablet Take 81 mg by mouth daily.    Marland Kitchen atorvastatin (LIPITOR) 10 MG tablet Take 1 tablet (10 mg total) by mouth daily. 90 tablet 3  . empagliflozin (JARDIANCE) 25 MG TABS tablet Take 25 mg by mouth daily. 90 tablet 0  . fenofibrate 160 MG tablet Take 1 tablet (160 mg total) by mouth daily. 90 tablet 3  . glucose blood test strip Check BS bid - tid 100 each 12  . Insulin Glargine (BASAGLAR KWIKPEN) 100 UNIT/ML SOPN INJECT 40 UNITS  UNDER THE SKIN TWICE A DAY 15 pen 3  . Insulin Pen Needle (PEN NEEDLES 3/16") 31G X 5 MM MISC Use with Victoza and Basaglar pens 300 each 3  . JARDIANCE 25 MG TABS tablet TAKE 1 TABLET BY MOUTH EVERY DAY 90 tablet 1  . liraglutide (VICTOZA) 18 MG/3ML SOPN INJECT 1.8 MGS SUBCUTANEOUSLY EVERY DAY 9 pen 5  . metFORMIN (GLUCOPHAGE) 1000 MG tablet TAKE 1 TABLET TWICE A DAY WITH A MEAL 180 tablet 3  . Omega-3 Fatty Acids (FISH OIL) 1200 MG CAPS Take by mouth.    . pioglitazone (ACTOS) 30 MG tablet Take 1 tablet (30 mg total) by mouth daily. 30 tablet 5  .  valACYclovir (VALTREX) 500 MG tablet TAKE 1 TABLET BY MOUTH TWICE A DAY 30 tablet 6   No current facility-administered medications on file prior to visit.    No Known Allergies Social History   Socioeconomic History  . Marital status: Married    Spouse name: Not on file  . Number of children: Not on file  . Years of education: Not on file  . Highest education level: Not on file  Occupational History  . Not on file  Social Needs  . Financial resource strain: Not on file  . Food insecurity:    Worry: Not on file    Inability: Not on file  . Transportation needs:    Medical: Not on file    Non-medical: Not on file  Tobacco Use  . Smoking status: Never Smoker  . Smokeless tobacco: Never Used  Substance and Sexual Activity  . Alcohol use: Yes    Comment: Rare  . Drug use: No  . Sexual activity: Not on file  Lifestyle  . Physical activity:    Days per week: Not on file    Minutes per session: Not on file  . Stress: Not on file  Relationships  . Social connections:    Talks on phone: Not on file    Gets together: Not on file    Attends religious service: Not on file    Active member of club or organization: Not on file    Attends meetings of clubs or organizations: Not on file    Relationship status: Not on file  . Intimate partner violence:    Fear of current or ex partner: Not on file    Emotionally abused: Not on file     Physically abused: Not on file    Forced sexual activity: Not on file  Other Topics Concern  . Not on file  Social History Narrative  . Not on file   No family history on file.    Review of Systems  All other systems reviewed and are negative.      Objective:   Physical Exam  Constitutional: He appears well-developed and well-nourished. No distress.  Cardiovascular: Normal rate, regular rhythm, normal heart sounds and intact distal pulses. Exam reveals no gallop and no friction rub.  No murmur heard. Pulmonary/Chest: Effort normal and breath sounds normal. No respiratory distress. He has no wheezes. He has no rales. He exhibits no tenderness.  Musculoskeletal: He exhibits no edema.  Skin: He is not diaphoretic.  Vitals reviewed.         Assessment & Plan:   Diabetes mellitus without complication ([facility]CC)  Hypertriglyceridemia  Low testosterone  Patient's blood pressure is acceptable.  I recommended increasing his Lantus 1 unit twice a day every day until fasting blood sugars are between 101 130.  Cholesterol is not at goal.  He is hesitant to increase his Lipitor due to his family history of myalgias however he will gradually increase his Lipitor to 20 mg a day and ultimately 40 mg a day over a period of several weeks if he can tolerate it.  Recheck fasting lab work in 3 months.  We had a long discussion about treating his hypogonadism.  Ultimately we have elected not to treat his low testosterone due to anger control issues he experiences when he takes testosterone

## 2018-01-31 ENCOUNTER — Other Ambulatory Visit: Payer: Self-pay | Admitting: Family Medicine

## 2018-02-21 ENCOUNTER — Encounter: Payer: Self-pay | Admitting: Family Medicine

## 2018-02-21 MED ORDER — TESTOSTERONE 50 MG/5GM (1%) TD GEL: TRANSDERMAL | 5 refills | Status: DC

## 2018-02-21 NOTE — Telephone Encounter (Signed)
Requesting refill    Testim  LOV: 01/25/18  LRF:  07/02/15 (in previous not he was not going to go back on this but has since changed his mind and has already restarted this again with what he had left over from previous rx and would like a refill)

## 2018-02-26 ENCOUNTER — Other Ambulatory Visit: Payer: Self-pay | Admitting: Family Medicine

## 2018-02-26 MED ORDER — INSULIN GLARGINE 100 UNIT/ML ~~LOC~~ SOLN
60.0000 [IU] | Freq: Two times a day (BID) | SUBCUTANEOUS | 1 refills | Status: DC
Start: 2018-02-26 — End: 2018-03-29

## 2018-02-28 ENCOUNTER — Telehealth: Payer: Self-pay | Admitting: Family Medicine

## 2018-02-28 NOTE — Telephone Encounter (Signed)
PA Submitted through [email or web address] and received the following:  [employer] is reviewing your PA request. Typically an electronic response will be received within 72 hours. To check for an update later, open this request from your dashboard.  You may close this dialog and return to your dashboard to perform other tasks. 

## 2018-03-05 DIAGNOSIS — R05 Cough: Secondary | ICD-10-CM | POA: Diagnosis not present

## 2018-03-05 DIAGNOSIS — J00 Acute nasopharyngitis [common cold]: Secondary | ICD-10-CM | POA: Diagnosis not present

## 2018-03-05 DIAGNOSIS — R030 Elevated blood-pressure reading, without diagnosis of hypertension: Secondary | ICD-10-CM | POA: Diagnosis not present

## 2018-03-11 ENCOUNTER — Encounter: Payer: Self-pay | Admitting: Family Medicine

## 2018-03-11 DIAGNOSIS — R7989 Other specified abnormal findings of blood chemistry: Secondary | ICD-10-CM

## 2018-03-12 NOTE — Telephone Encounter (Signed)
This was denied as he need 2 early morning testosterones in order to get ins to cover - pt aware and will stop in the AM to have drawn. Will do an appeal once results are back.

## 2018-03-13 ENCOUNTER — Other Ambulatory Visit: Payer: 59

## 2018-03-14 ENCOUNTER — Other Ambulatory Visit: Payer: 59

## 2018-03-14 DIAGNOSIS — R7989 Other specified abnormal findings of blood chemistry: Secondary | ICD-10-CM

## 2018-03-15 LAB — TESTOSTERONE: Testosterone: 179 ng/dL — ABNORMAL LOW (ref 250–827)

## 2018-03-19 NOTE — Telephone Encounter (Signed)
Can you do an appeal for his testosterone gel please.

## 2018-03-22 ENCOUNTER — Encounter: Payer: Self-pay | Admitting: *Deleted

## 2018-03-22 NOTE — Telephone Encounter (Signed)
Appeal Faxed.

## 2018-03-23 ENCOUNTER — Other Ambulatory Visit: Payer: Self-pay | Admitting: Family Medicine

## 2018-03-29 ENCOUNTER — Other Ambulatory Visit: Payer: Self-pay | Admitting: Family Medicine

## 2018-04-03 ENCOUNTER — Encounter: Payer: Self-pay | Admitting: Family Medicine

## 2018-04-10 ENCOUNTER — Encounter: Payer: Self-pay | Admitting: Family Medicine

## 2018-04-14 NOTE — Telephone Encounter (Signed)
Received appeal determination.   Appeal approved through 09/25/2018.

## 2018-04-19 ENCOUNTER — Encounter: Payer: Self-pay | Admitting: Family Medicine

## 2018-04-23 ENCOUNTER — Other Ambulatory Visit: Payer: Self-pay | Admitting: Family Medicine

## 2018-04-30 ENCOUNTER — Encounter: Payer: Self-pay | Admitting: Family Medicine

## 2018-04-30 ENCOUNTER — Other Ambulatory Visit: Payer: 59

## 2018-04-30 DIAGNOSIS — E781 Pure hyperglyceridemia: Secondary | ICD-10-CM | POA: Diagnosis not present

## 2018-04-30 DIAGNOSIS — E119 Type 2 diabetes mellitus without complications: Secondary | ICD-10-CM | POA: Diagnosis not present

## 2018-04-30 DIAGNOSIS — R7989 Other specified abnormal findings of blood chemistry: Secondary | ICD-10-CM

## 2018-05-01 ENCOUNTER — Other Ambulatory Visit: Payer: Self-pay | Admitting: Family Medicine

## 2018-05-01 LAB — COMPREHENSIVE METABOLIC PANEL
AG Ratio: 2.2 (calc) (ref 1.0–2.5)
ALBUMIN MSPROF: 4.3 g/dL (ref 3.6–5.1)
ALT: 15 U/L (ref 9–46)
AST: 16 U/L (ref 10–35)
Alkaline phosphatase (APISO): 49 U/L (ref 35–144)
BILIRUBIN TOTAL: 0.5 mg/dL (ref 0.2–1.2)
BUN / CREAT RATIO: 24 (calc) — AB (ref 6–22)
BUN: 16 mg/dL (ref 7–25)
CALCIUM: 9.5 mg/dL (ref 8.6–10.3)
CO2: 25 mmol/L (ref 20–32)
Chloride: 108 mmol/L (ref 98–110)
Creat: 0.67 mg/dL — ABNORMAL LOW (ref 0.70–1.33)
Globulin: 2 g/dL (calc) (ref 1.9–3.7)
Glucose, Bld: 86 mg/dL (ref 65–99)
Potassium: 4.6 mmol/L (ref 3.5–5.3)
SODIUM: 144 mmol/L (ref 135–146)
Total Protein: 6.3 g/dL (ref 6.1–8.1)

## 2018-05-01 LAB — LIPID PANEL
CHOLESTEROL: 138 mg/dL (ref <200)
HDL: 24 mg/dL — ABNORMAL LOW (ref >=40)
LDL Cholesterol (Calc): 87 mg/dL (calc)
Non-HDL Cholesterol (Calc): 114 mg/dL (calc) (ref <130)
Total CHOL/HDL Ratio: 5.8 (calc) — ABNORMAL HIGH (ref <5.0)
Triglycerides: 175 mg/dL — ABNORMAL HIGH (ref <150)

## 2018-05-01 LAB — CBC WITH DIFFERENTIAL/PLATELET
ABSOLUTE MONOCYTES: 704 {cells}/uL (ref 200–950)
BASOS PCT: 1.6 %
Basophils Absolute: 128 cells/uL (ref 0–200)
EOS ABS: 560 {cells}/uL — AB (ref 15–500)
Eosinophils Relative: 7 %
HEMATOCRIT: 47.1 % (ref 38.5–50.0)
HEMOGLOBIN: 15.6 g/dL (ref 13.2–17.1)
LYMPHS ABS: 2936 {cells}/uL (ref 850–3900)
MCH: 30.3 pg (ref 27.0–33.0)
MCHC: 33.1 g/dL (ref 32.0–36.0)
MCV: 91.5 fL (ref 80.0–100.0)
MPV: 11.1 fL (ref 7.5–12.5)
Monocytes Relative: 8.8 %
Neutro Abs: 3672 cells/uL (ref 1500–7800)
Neutrophils Relative %: 45.9 %
PLATELETS: 240 10*3/uL (ref 140–400)
RBC: 5.15 10*6/uL (ref 4.20–5.80)
RDW: 13.4 % (ref 11.0–15.0)
TOTAL LYMPHOCYTE: 36.7 %
WBC: 8 10*3/uL (ref 3.8–10.8)

## 2018-05-01 LAB — TESTOSTERONE: Testosterone: 413 ng/dL (ref 250–827)

## 2018-05-01 LAB — HEMOGLOBIN A1C
Hgb A1c MFr Bld: 6 % of total Hgb — ABNORMAL HIGH (ref <5.7)
Mean Plasma Glucose: 126 (calc)
eAG (mmol/L): 7 (calc)

## 2018-05-02 ENCOUNTER — Other Ambulatory Visit: Payer: Self-pay | Admitting: Family Medicine

## 2018-05-03 ENCOUNTER — Ambulatory Visit: Payer: 59 | Admitting: Family Medicine

## 2018-05-03 ENCOUNTER — Encounter: Payer: Self-pay | Admitting: Family Medicine

## 2018-05-03 VITALS — BP 104/60 | HR 80 | Temp 97.6°F | Resp 16 | Ht 71.0 in | Wt 267.0 lb

## 2018-05-03 DIAGNOSIS — Z1211 Encounter for screening for malignant neoplasm of colon: Secondary | ICD-10-CM | POA: Diagnosis not present

## 2018-05-03 DIAGNOSIS — E781 Pure hyperglyceridemia: Secondary | ICD-10-CM

## 2018-05-03 DIAGNOSIS — E119 Type 2 diabetes mellitus without complications: Secondary | ICD-10-CM

## 2018-05-03 DIAGNOSIS — R7989 Other specified abnormal findings of blood chemistry: Secondary | ICD-10-CM

## 2018-05-03 NOTE — Progress Notes (Signed)
Subjective:    Patient ID: Isaiah Beasley, male    DOB: 11-Nov-1965, 53 y.o.   MRN: [id number]  Medication Refill     10/03/17 Patient ultimately had a HIDA scan which revealed findings consistent with biliary dyskinesia however he attributes the right upper quadrant abdominal pain to his insulin.  On 3 separate occasions he has discontinued insulin.  On each of those occasions the pain completely subsided.  In each occasion shortly after resuming insulin, the pain would return.  During this last period of time, he has been off his insulin now for more than a month and a half and has not had any further right upper quadrant pain for a month and a half.  Therefore he is convinced that the insulin was playing some kind of role in his biliary dyskinesia.  Patient denies any polyuria, polydipsia, or blurry vision.  He denies any chest pain shortness of breath or dyspnea on exertion.  He is elected not to treat his hypogonadism despite his metabolic syndrome.  While taking testosterone replacement, the patient had anger control issues and therefore has elected not to use testosterone.  He is only lost 2 pounds since his last office visit.  He is exercising 4 to 5 days a week however he admits that he is eating more than 3000 cal a day.  At that time, my plan was: Add Actos 30 mg a day and recheck hemoglobin A1c in 3 months.  Otherwise continue his current medications.  Recommended decreasing his calorie intake to 1800 to 1500 cal/day and continuing to exercise 30 minutes a day 5 days a week.  Ideally I like to see the patient gradually lose 15 to 30 pounds to help control his diabetes and his dyslipidemia.  Blood pressure is acceptable.  Patient is elected not to treat hypogonadism.  I will recheck a testosterone level and a PSA at his next visit.  01/25/18 He denies any chest pain shortness of breath or dyspnea on exertion.  He denies any myalgias or right upper quadrant pain.  He denies any nausea or  vomiting.  He occasionally has some neuropathy in his feet when his blood sugars are out of control.  He denies any polyuria, polydipsia, or blurry vision.  He is gradually resumed his Lantus and his increase to 40 units twice a day.  His fasting blood sugars are between 130 and 160.  He discontinued his Actos.  At that time, my plan is as follows: Patient's blood pressure is acceptable.  I recommended increasing his Lantus 1 unit twice a day every day until fasting blood sugars are between 101 130.  Cholesterol is not at goal.  He is hesitant to increase his Lipitor due to his family history of myalgias however he will gradually increase his Lipitor to 20 mg a day and ultimately 40 mg a day over a period of several weeks if he can tolerate it.  Recheck fasting lab work in 3 months.  We had a long discussion about treating his hypogonadism.  Ultimately we have elected not to treat his low testosterone due to anger control issues he experiences when he takes testosterone.  05/03/18 Most recent labs are listed below: Lab on 04/30/2018  Component Date Value Ref Range Status  . WBC 04/30/2018 8.0  3.8 - 10.8 Thousand/uL Final  . RBC 04/30/2018 5.15  4.20 - 5.80 Million/uL Final  . Hemoglobin 04/30/2018 15.6  13.2 - 17.1 g/dL Final  . HCT 04/30/2018 47.1  38.5 -  50.0 % Final  . MCV 04/30/2018 91.5  80.0 - 100.0 fL Final  . MCH 04/30/2018 30.3  27.0 - 33.0 pg Final  . MCHC 04/30/2018 33.1  32.0 - 36.0 g/dL Final  . RDW 04/30/2018 13.4  11.0 - 15.0 % Final  . Platelets 04/30/2018 240  140 - 400 Thousand/uL Final  . MPV 04/30/2018 11.1  7.5 - 12.5 fL Final  . Neutro Abs 04/30/2018 3,672  1,500 - 7,800 cells/uL Final  . Lymphs Abs 04/30/2018 2,936  850 - 3,900 cells/uL Final  . Absolute Monocytes 04/30/2018 704  200 - 950 cells/uL Final  . Eosinophils Absolute 04/30/2018 560* 15 - 500 cells/uL Final  . Basophils Absolute 04/30/2018 128  0 - 200 cells/uL Final  . Neutrophils Relative % 04/30/2018 45.9  %  Final  . Total Lymphocyte 04/30/2018 36.7  % Final  . Monocytes Relative 04/30/2018 8.8  % Final  . Eosinophils Relative 04/30/2018 7.0  % Final  . Basophils Relative 04/30/2018 1.6  % Final  . Glucose, Bld 04/30/2018 86  65 - 99 mg/dL Final   Comment: .            Fasting reference interval .   . BUN 04/30/2018 16  7 - 25 mg/dL Final  . Creat 04/30/2018 0.67* 0.70 - 1.33 mg/dL Final   Comment: For patients >48 years of age, the reference limit for Creatinine is approximately 13% higher for people identified as African-American. .   Havery Moros Ratio 04/30/2018 24* 6 - 22 (calc) Final  . Sodium 04/30/2018 144  135 - 146 mmol/L Final  . Potassium 04/30/2018 4.6  3.5 - 5.3 mmol/L Final  . Chloride 04/30/2018 108  98 - 110 mmol/L Final  . CO2 04/30/2018 25  20 - 32 mmol/L Final  . Calcium 04/30/2018 9.5  8.6 - 10.3 mg/dL Final  . Total Protein 04/30/2018 6.3  6.1 - 8.1 g/dL Final  . Albumin 04/30/2018 4.3  3.6 - 5.1 g/dL Final  . Globulin 04/30/2018 2.0  1.9 - 3.7 g/dL (calc) Final  . AG Ratio 04/30/2018 2.2  1.0 - 2.5 (calc) Final  . Total Bilirubin 04/30/2018 0.5  0.2 - 1.2 mg/dL Final  . Alkaline phosphatase (APISO) 04/30/2018 49  35 - 144 U/L Final  . AST 04/30/2018 16  10 - 35 U/L Final  . ALT 04/30/2018 15  9 - 46 U/L Final  . Hgb A1c MFr Bld 04/30/2018 6.0* <5.7 % of total Hgb Final   Comment: For someone without known diabetes, a hemoglobin  A1c value between 5.7% and 6.4% is consistent with prediabetes and should be confirmed with a  follow-up test. . For someone with known diabetes, a value <7% indicates that their diabetes is well controlled. A1c targets should be individualized based on duration of diabetes, age, comorbid conditions, and other considerations. . This assay result is consistent with an increased risk of diabetes. . Currently, no consensus exists regarding use of hemoglobin A1c for diagnosis of diabetes for children. .   . Mean Plasma  Glucose 04/30/2018 126  (calc) Final  . eAG (mmol/L) 04/30/2018 7.0  (calc) Final  . Cholesterol 04/30/2018 138  <200 mg/dL Final  . HDL 04/30/2018 24* > OR = 40 mg/dL Final  . Triglycerides 04/30/2018 175* <150 mg/dL Final  . LDL Cholesterol (Calc) 04/30/2018 87  mg/dL (calc) Final   Comment: Reference range: <100 . Desirable range <100 mg/dL for primary prevention;   <70 mg/dL for patients with CHD  or diabetic patients  with > or = 2 CHD risk factors. Marland Kitchen LDL-C is now calculated using the Martin-Hopkins  calculation, which is a validated novel method providing  better accuracy than the Friedewald equation in the  estimation of LDL-C.  Cresenciano Genre et al. Annamaria Helling. 3212;248(25): 2061-2068  (http://education.[facility].com/faq/FAQ164)   . Total CHOL/HDL Ratio 04/30/2018 5.8* <5.0 (calc) Final  . Non-HDL Cholesterol (Calc) 04/30/2018 114  <130 mg/dL (calc) Final   Comment: For patients with diabetes plus 1 major ASCVD risk  factor, treating to a non-HDL-C goal of <100 mg/dL  (LDL-C of <70 mg/dL) is considered a therapeutic  option.   . Testosterone 04/30/2018 413  250 - 827 ng/dL Final   Patient has done very well with regards to his blood sugar.  In fact he has been able to reduce his insulin down to 30 units a day.  When he stopped his insulin due to hypoglycemia, he saw blood sugars rise into the 110s and 120s.  I explained to the patient this is still excellent and therefore I do not feel that he still requires insulin.  I encouraged him to stop the insulin and monitor his blood sugars.  As long as his fasting blood sugars under 130 and his 2-hour postprandial sugars under 180 I would not resume insulin.  Patient is comfortable with this.  His testosterone level is now back to normal.  He denies any fatigue or issues with his energy.  He is exercising daily and walking 10,000 steps a day.  I did asked the patient to try to increase the intensity of his aerobic exercise due to his  diminished HDL.  However his LDL and triglycerides are outstanding for this patient.  Diabetic foot exam was performed today and is normal.  The patient is also due for a colonoscopy and he agrees  Past Medical History:  Diagnosis Date  . Diabetes mellitus without complication ([place])   . Hyperlipidemia   . Hypertension   . Wandering (atrial) pacemaker     Current Outpatient Medications on File Prior to Visit  Medication Sig Dispense Refill  . aspirin 81 MG tablet Take 81 mg by mouth daily.    Marland Kitchen atorvastatin (LIPITOR) 10 MG tablet Take 1 tablet (10 mg total) by mouth daily. 90 tablet 3  . fenofibrate 160 MG tablet Take 1 tablet (160 mg total) by mouth daily. 90 tablet 3  . glucose blood test strip Check BS bid - tid 100 each 12  . Insulin Glargine (BASAGLAR KWIKPEN) 100 UNIT/ML SOPN INJECT 40 UNITS UNDER THE SKIN TWICE A DAY 15 mL 1  . Insulin Pen Needle (PEN NEEDLES 3/16") 31G X 5 MM MISC Use with Victoza and Basaglar pens 300 each 3  . JARDIANCE 25 MG TABS tablet TAKE 1 TABLET BY MOUTH EVERY DAY 90 tablet 1  . liraglutide (VICTOZA) 18 MG/3ML SOPN INJECT 1.8 MGS SUBCUTANEOUSLY EVERY DAY 9 pen 5  . metFORMIN (GLUCOPHAGE) 1000 MG tablet TAKE 1 TABLET TWICE A DAY WITH A MEAL 180 tablet 3  . Omega-3 Fatty Acids (FISH OIL) 1200 MG CAPS Take by mouth.    . testosterone (TESTIM) 50 MG/5GM (1%) GEL APPLY 5 GMS TO SKIN DAILY 150 g 5  . valACYclovir (VALTREX) 500 MG tablet TAKE 1 TABLET BY MOUTH TWICE A DAY 30 tablet 6   No current facility-administered medications on file prior to visit.    No Known Allergies Social History   Socioeconomic History  . Marital status: Married  Spouse name: Not on file  . Number of children: Not on file  . Years of education: Not on file  . Highest education level: Not on file  Occupational History  . Not on file  Social Needs  . Financial resource strain: Not on file  . Food insecurity:    Worry: Not on file    Inability: Not on file  .  Transportation needs:    Medical: Not on file    Non-medical: Not on file  Tobacco Use  . Smoking status: Never Smoker  . Smokeless tobacco: Never Used  Substance and Sexual Activity  . Alcohol use: Yes    Comment: Rare  . Drug use: No  . Sexual activity: Not on file  Lifestyle  . Physical activity:    Days per week: Not on file    Minutes per session: Not on file  . Stress: Not on file  Relationships  . Social connections:    Talks on phone: Not on file    Gets together: Not on file    Attends religious service: Not on file    Active member of club or organization: Not on file    Attends meetings of clubs or organizations: Not on file    Relationship status: Not on file  . Intimate partner violence:    Fear of current or ex partner: Not on file    Emotionally abused: Not on file    Physically abused: Not on file    Forced sexual activity: Not on file  Other Topics Concern  . Not on file  Social History Narrative  . Not on file   No family history on file.    Review of Systems  All other systems reviewed and are negative.      Objective:   Physical Exam  Constitutional: He appears well-developed and well-nourished. No distress.  Cardiovascular: Normal rate, regular rhythm, normal heart sounds and intact distal pulses. Exam reveals no gallop and no friction rub.  No murmur heard. Pulmonary/Chest: Effort normal and breath sounds normal. No respiratory distress. He has no wheezes. He has no rales. He exhibits no tenderness.  Musculoskeletal:        General: No edema.  Skin: He is not diaphoretic.  Vitals reviewed.         Assessment & Plan:  Colon cancer screening - Plan: Ambulatory referral to Gastroenterology  Diabetes mellitus without complication ([place])  Hypertriglyceridemia  Low testosterone  Patient's lab work is excellent.  Is the best of seen in quite some time for him.  I want him to stop his insulin.  Monitor his blood sugar daily.  If his  fasting blood sugars rise greater than 130 or his 2-hour postprandial sugars rise greater than 180, I have recommended that he resume his insulin at 10 units a day.  Recheck lab work in 6 months.  I will schedule the patient for colonoscopy.  Prostate cancer screenings up-to-date.  Blood pressure is outstanding.  Cholesterol is excellent for him.  His HDL is still terribly low.  I recommended 30 minutes a day 5 days a week of vigorous aerobic exercise.  Testosterone level is now in a normal range.  We will make no other changes to his medication at this time

## 2018-05-15 ENCOUNTER — Encounter: Payer: Self-pay | Admitting: Family Medicine

## 2018-06-05 ENCOUNTER — Encounter: Payer: Self-pay | Admitting: Family Medicine

## 2018-06-29 ENCOUNTER — Other Ambulatory Visit: Payer: Self-pay | Admitting: Family Medicine

## 2018-07-02 ENCOUNTER — Other Ambulatory Visit: Payer: Self-pay

## 2018-07-02 MED ORDER — LIRAGLUTIDE 18 MG/3ML ~~LOC~~ SOPN: PEN_INJECTOR | SUBCUTANEOUS | 5 refills | Status: DC

## 2018-09-04 ENCOUNTER — Other Ambulatory Visit: Payer: Self-pay | Admitting: Family Medicine

## 2018-09-05 ENCOUNTER — Other Ambulatory Visit: Payer: Self-pay | Admitting: *Deleted

## 2018-09-05 MED ORDER — INSULIN GLARGINE (1 UNIT DIAL) 300 UNIT/ML ~~LOC~~ SOPN: 40.0000 [IU] | PEN_INJECTOR | Freq: Two times a day (BID) | SUBCUTANEOUS | 3 refills | Status: DC

## 2018-09-05 NOTE — Telephone Encounter (Signed)
Received fax requesting alternative to [place]. Medication is not covered by insurance.   Sent [place].

## 2018-09-15 ENCOUNTER — Other Ambulatory Visit: Payer: Self-pay | Admitting: Family Medicine

## 2018-09-17 NOTE — Telephone Encounter (Signed)
Ok to refill??  Last office visit 05/03/2018.  Last refill 02/21/2018, #5 refills.

## 2018-09-21 ENCOUNTER — Telehealth: Payer: Self-pay

## 2018-09-21 NOTE — Telephone Encounter (Signed)
PA submitted via CMM for Testosterone 50 MG/5GM(1%) gel. Waiting on reply.

## 2018-10-03 ENCOUNTER — Telehealth: Payer: Self-pay | Admitting: *Deleted

## 2018-10-03 NOTE — Telephone Encounter (Signed)
Received PA determination for Testosterone.   PA denied as (2) levels were not sent in for review.   Appeal faxed.

## 2018-10-10 NOTE — Telephone Encounter (Signed)
Received appeal determination.   Approved 09/21/2018- 03/27/2038.  Reference #:[id number]  Pharmacy made aware.

## 2019-01-06 ENCOUNTER — Other Ambulatory Visit: Payer: Self-pay | Admitting: Family Medicine

## 2019-04-04 ENCOUNTER — Other Ambulatory Visit: Payer: Self-pay | Admitting: Family Medicine

## 2019-04-30 LAB — HM DIABETES EYE EXAM

## 2019-05-23 ENCOUNTER — Other Ambulatory Visit: Payer: Self-pay | Admitting: Family Medicine

## 2019-06-02 DIAGNOSIS — Z23 Encounter for immunization: Secondary | ICD-10-CM | POA: Diagnosis not present

## 2019-06-23 DIAGNOSIS — Z23 Encounter for immunization: Secondary | ICD-10-CM | POA: Diagnosis not present

## 2019-08-13 ENCOUNTER — Other Ambulatory Visit: Payer: BC Managed Care – PPO

## 2019-08-13 ENCOUNTER — Other Ambulatory Visit: Payer: Self-pay

## 2019-08-13 DIAGNOSIS — E119 Type 2 diabetes mellitus without complications: Secondary | ICD-10-CM

## 2019-08-13 DIAGNOSIS — E781 Pure hyperglyceridemia: Secondary | ICD-10-CM | POA: Diagnosis not present

## 2019-08-13 DIAGNOSIS — R7989 Other specified abnormal findings of blood chemistry: Secondary | ICD-10-CM

## 2019-08-13 DIAGNOSIS — Z125 Encounter for screening for malignant neoplasm of prostate: Secondary | ICD-10-CM | POA: Diagnosis not present

## 2019-08-15 ENCOUNTER — Other Ambulatory Visit: Payer: Self-pay

## 2019-08-15 ENCOUNTER — Encounter: Payer: Self-pay | Admitting: Family Medicine

## 2019-08-15 ENCOUNTER — Telehealth: Payer: Self-pay | Admitting: *Deleted

## 2019-08-15 ENCOUNTER — Ambulatory Visit (INDEPENDENT_AMBULATORY_CARE_PROVIDER_SITE_OTHER): Payer: BC Managed Care – PPO | Admitting: Family Medicine

## 2019-08-15 VITALS — BP 138/78 | HR 88 | Temp 96.8°F | Resp 18 | Ht 71.0 in | Wt 276.0 lb

## 2019-08-15 DIAGNOSIS — Z0001 Encounter for general adult medical examination with abnormal findings: Secondary | ICD-10-CM

## 2019-08-15 DIAGNOSIS — Z1211 Encounter for screening for malignant neoplasm of colon: Secondary | ICD-10-CM

## 2019-08-15 DIAGNOSIS — Z125 Encounter for screening for malignant neoplasm of prostate: Secondary | ICD-10-CM

## 2019-08-15 DIAGNOSIS — T466X5A Adverse effect of antihyperlipidemic and antiarteriosclerotic drugs, initial encounter: Secondary | ICD-10-CM

## 2019-08-15 DIAGNOSIS — E118 Type 2 diabetes mellitus with unspecified complications: Secondary | ICD-10-CM | POA: Diagnosis not present

## 2019-08-15 DIAGNOSIS — E781 Pure hyperglyceridemia: Secondary | ICD-10-CM

## 2019-08-15 DIAGNOSIS — Z0000 Encounter for general adult medical examination without abnormal findings: Secondary | ICD-10-CM

## 2019-08-15 DIAGNOSIS — E1165 Type 2 diabetes mellitus with hyperglycemia: Secondary | ICD-10-CM

## 2019-08-15 DIAGNOSIS — IMO0002 Reserved for concepts with insufficient information to code with codable children: Secondary | ICD-10-CM

## 2019-08-15 DIAGNOSIS — G720 Drug-induced myopathy: Secondary | ICD-10-CM

## 2019-08-15 MED ORDER — JARDIANCE 25 MG PO TABS: 25.0000 mg | ORAL_TABLET | Freq: Every day | ORAL | 3 refills | Status: DC

## 2019-08-15 NOTE — Progress Notes (Signed)
Subjective:    Patient ID: Isaiah Beasley, male    DOB: March 13, 1966, 54 y.o.   MRN: [id number]  Patient is here today for complete physical exam.  Since I last saw the patient, he stopped Jardiance, he stopped exercising due to plantar fasciitis, and he discontinued his keto diet.  As result his A1c has jumped dramatically from 6.0-9.4.  His triglycerides dropped from 170-617.  He did have a diabetic eye exam performed 2 months ago that was normal.  Diabetic foot exam was performed today.  He declines HIV and hepatitis C screening.  He is due for colonoscopy but he refuses colonoscopy.  His immunizations are up-to-date.  Please see his lab work below. Immunization History  Administered Date(s) Administered  . Influenza,inj,Quad PF,6+ Mos 01/17/2013, 12/05/2014, 12/11/2015, 02/02/2017, 01/05/2018  . Influenza-Unspecified 12/17/2009, 01/07/2014  . PFIZER SARS-COV-2 Vaccination 06/02/2019, 06/23/2019  . Pneumococcal Polysaccharide-23 06/04/2015  . Tdap 05/09/2011    Lab on 08/13/2019  Component Date Value Ref Range Status  . Hgb A1c MFr Bld 08/13/2019 9.4* <5.7 % of total Hgb Final   Comment: For someone without known diabetes, a hemoglobin A1c value of 6.5% or greater indicates that they may have  diabetes and this should be confirmed with a follow-up  test. . For someone with known diabetes, a value <7% indicates  that their diabetes is well controlled and a value  greater than or equal to 7% indicates suboptimal  control. A1c targets should be individualized based on  duration of diabetes, age, comorbid conditions, and  other considerations. . Currently, no consensus exists regarding use of hemoglobin A1c for diagnosis of diabetes for children. .   . Mean Plasma Glucose 08/13/2019 223  (calc) Final  . eAG (mmol/L) 08/13/2019 12.4  (calc) Final  . Cholesterol 08/13/2019 219* <200 mg/dL Final  . HDL 08/13/2019 23* > OR = 40 mg/dL Final  . Triglycerides 08/13/2019 617* <150 mg/dL  Final   Comment: . If a non-fasting specimen was collected, consider repeat triglyceride testing on a fasting specimen if clinically indicated.  [name] et al. J. of Clin. Lipidol. [id number]. . . There is increased risk of pancreatitis when the  triglyceride concentration is very high  (> or = 500 mg/dL, especially if > or = 1000 mg/dL).  [name] et al. J. of Clin. Lipidol. [id number]. .   . LDL Cholesterol (Calc) 08/13/2019   mg/dL (calc) Final   Comment: . LDL cholesterol not calculated. Triglyceride levels greater than 400 mg/dL invalidate calculated LDL results. . Reference range: <100 . Desirable range <100 mg/dL for primary prevention;   <70 mg/dL for patients with CHD or diabetic patients  with > or = 2 CHD risk factors. Marland Kitchen LDL-C is now calculated using the Martin-Hopkins  calculation, which is a validated novel method providing  better accuracy than the Friedewald equation in the  estimation of LDL-C.  Cresenciano Genre et al. Annamaria Helling. [id number]): 2061-2068  (http://education.[facility].com/faq/FAQ164)   . Total CHOL/HDL Ratio 08/13/2019 9.5* <5.0 (calc) Final  . Non-HDL Cholesterol (Calc) 08/13/2019 196* <130 mg/dL (calc) Final   Comment: For patients with diabetes plus 1 major ASCVD risk  factor, treating to a non-HDL-C goal of <100 mg/dL  (LDL-C of <70 mg/dL) is considered a therapeutic  option.   . WBC 08/13/2019 5.8  3.8 - 10.8 Thousand/uL Final  . RBC 08/13/2019 5.34  4.20 - 5.80 Million/uL Final  . Hemoglobin 08/13/2019 16.3  13.2 - 17.1 g/dL Final  . HCT 08/13/2019 50.0  38.5 -  50.0 % Final  . MCV 08/13/2019 93.6  80.0 - 100.0 fL Final  . MCH 08/13/2019 30.5  27.0 - 33.0 pg Final  . MCHC 08/13/2019 32.6  32.0 - 36.0 g/dL Final  . RDW 08/13/2019 12.0  11.0 - 15.0 % Final  . Platelets 08/13/2019 218  140 - 400 Thousand/uL Final  . MPV 08/13/2019 11.0  7.5 - 12.5 fL Final  . Neutro Abs 08/13/2019 2,598  1,500 - 7,800 cells/uL Final  . Lymphs Abs  08/13/2019 2,436  850 - 3,900 cells/uL Final  . Absolute Monocytes 08/13/2019 603  200 - 950 cells/uL Final  . Eosinophils Absolute 08/13/2019 110  15 - 500 cells/uL Final  . Basophils Absolute 08/13/2019 52  0 - 200 cells/uL Final  . Neutrophils Relative % 08/13/2019 44.8  % Final  . Total Lymphocyte 08/13/2019 42.0  % Final  . Monocytes Relative 08/13/2019 10.4  % Final  . Eosinophils Relative 08/13/2019 1.9  % Final  . Basophils Relative 08/13/2019 0.9  % Final  . Glucose, Bld 08/13/2019 266* 65 - 99 mg/dL Final   Comment: .            Fasting reference interval . For someone without known diabetes, a glucose value >125 mg/dL indicates that they may have diabetes and this should be confirmed with a follow-up test. .   . BUN 08/13/2019 10  7 - 25 mg/dL Final  . Creat 08/13/2019 0.61* 0.70 - 1.33 mg/dL Final   Comment: For patients >71 years of age, the reference limit for Creatinine is approximately 13% higher for people identified as African-American. .   Havery Moros Ratio 08/13/2019 16  6 - 22 (calc) Final  . Sodium 08/13/2019 137  135 - 146 mmol/L Final  . Potassium 08/13/2019 4.0  3.5 - 5.3 mmol/L Final  . Chloride 08/13/2019 102  98 - 110 mmol/L Final  . CO2 08/13/2019 24  20 - 32 mmol/L Final  . Calcium 08/13/2019 8.8  8.6 - 10.3 mg/dL Final  . Total Protein 08/13/2019 6.1  6.1 - 8.1 g/dL Final  . Albumin 08/13/2019 4.1  3.6 - 5.1 g/dL Final  . Globulin 08/13/2019 2.0  1.9 - 3.7 g/dL (calc) Final  . AG Ratio 08/13/2019 2.1  1.0 - 2.5 (calc) Final  . Total Bilirubin 08/13/2019 0.6  0.2 - 1.2 mg/dL Final  . Alkaline phosphatase (APISO) 08/13/2019 65  35 - 144 U/L Final  . AST 08/13/2019 24  10 - 35 U/L Final  . ALT 08/13/2019 34  9 - 46 U/L Final  . Testosterone 08/13/2019 125* 250 - 827 ng/dL Final   Comment: In hypogonadal males, Testosterone, Total, LC/MS/MS, is the recommended assay due to the diminished accuracy of immunoassay at levels below 250  ng/dL. This test code [phone]) must be collected in a red-top tube with no gel.       Past Medical History:  Diagnosis Date  . Diabetes mellitus without complication ([place])   . Hyperlipidemia   . Hypertension   . Wandering (atrial) pacemaker     Current Outpatient Medications on File Prior to Visit  Medication Sig Dispense Refill  . fenofibrate 160 MG tablet TAKE 1 TABLET BY MOUTH EVERY DAY 90 tablet 3  . glucose blood test strip Check BS bid - tid 100 each 12  . Insulin Glargine, 1 Unit Dial, (TOUJEO SOLOSTAR) 300 UNIT/ML SOPN Inject 40 Units into the skin 2 (two) times a day. 45 mL 3  . Insulin Pen  Needle (PEN NEEDLES 3/16") 31G X 5 MM MISC Use with Victoza and Basaglar pens 300 each 3  . liraglutide (VICTOZA) 18 MG/3ML SOPN INJECT 1.8 MGS SUBCUTANEOUSLY EVERY DAY 9 pen 5  . metFORMIN (GLUCOPHAGE) 1000 MG tablet TAKE 1 TABLET BY MOUTH TWICE A DAY WITH A MEAL 180 tablet 3  . Omega-3 Fatty Acids (FISH OIL) 1200 MG CAPS Take by mouth.    . valACYclovir (VALTREX) 500 MG tablet TAKE 1 TABLET BY MOUTH TWICE A DAY 180 tablet 1   No current facility-administered medications on file prior to visit.   No Known Allergies Social History   Socioeconomic History  . Marital status: Married    Spouse name: Not on file  . Number of children: Not on file  . Years of education: Not on file  . Highest education level: Not on file  Occupational History  . Not on file  Tobacco Use  . Smoking status: Never Smoker  . Smokeless tobacco: Never Used  Substance and Sexual Activity  . Alcohol use: Yes    Comment: Rare  . Drug use: No  . Sexual activity: Not on file  Other Topics Concern  . Not on file  Social History Narrative  . Not on file   Social Determinants of Health   Financial Resource Strain:   . Difficulty of Paying Living Expenses:   Food Insecurity:   . Worried About Charity fundraiser in the Last Year:   . Arboriculturist in the Last Year:   Transportation Needs:   . Consulting civil engineer (Medical):   Marland Kitchen Lack of Transportation (Non-Medical):   Physical Activity:   . Days of Exercise per Week:   . Minutes of Exercise per Session:   Stress:   . Feeling of Stress :   Social Connections:   . Frequency of Communication with Friends and Family:   . Frequency of Social Gatherings with Friends and Family:   . Attends Religious Services:   . Active Member of Clubs or Organizations:   . Attends Archivist Meetings:   Marland Kitchen Marital Status:   Intimate Partner Violence:   . Fear of Current or Ex-Partner:   . Emotionally Abused:   Marland Kitchen Physically Abused:   . Sexually Abused:    No family history on file.    Review of Systems  All other systems reviewed and are negative.      Objective:   Physical Exam  Constitutional: He is oriented to person, place, and time. He appears well-developed and well-nourished. No distress.  HENT:  Head: Normocephalic and atraumatic.  Right Ear: External ear normal.  Left Ear: External ear normal.  Nose: Nose normal.  Mouth/Throat: Oropharynx is clear and moist. No oropharyngeal exudate.  Eyes: Pupils are equal, round, and reactive to light. Conjunctivae and EOM are normal. Right eye exhibits no discharge. Left eye exhibits no discharge. No scleral icterus.  Neck: No JVD present. No tracheal deviation present. No thyromegaly present.  Cardiovascular: Normal rate, regular rhythm, normal heart sounds and intact distal pulses. Exam reveals no gallop and no friction rub.  No murmur heard. Pulmonary/Chest: Effort normal and breath sounds normal. No stridor. No respiratory distress. He has no wheezes. He has no rales. He exhibits no tenderness.  Abdominal: Soft. Bowel sounds are normal. He exhibits no distension and no mass. There is no abdominal tenderness. There is no rebound and no guarding.  Musculoskeletal:        General: No  edema.     Cervical back: Normal range of motion and neck supple.  Lymphadenopathy:    He has no  cervical adenopathy.  Neurological: He is alert and oriented to person, place, and time. He has normal reflexes. He displays normal reflexes. No cranial nerve deficit. He exhibits normal muscle tone. Coordination normal.  Skin: He is not diaphoretic.  Psychiatric: He has a normal mood and affect. His behavior is normal. Judgment and thought content normal.  Vitals reviewed.         Assessment & Plan:  Prostate cancer screening - Plan: PSA  DM (diabetes mellitus), type 2, uncontrolled with complications ([place]) - Plan: Microalbumin, urine  Colon cancer screening  Hypertriglyceridemia  General medical exam  Diabetes is out of control.  We discussed increasing insulin versus therapeutic lifestyle changes versus adding Jardiance.  Patient elects to start Jardiance 25 mg a day, switch to a keto diet, and begin to exercise 30 minutes a day 5 days a week and then recheck lab work in 3 to 4 months.  Blood pressure today is acceptable.  I will screen for microalbuminuria.  We discussed a colonoscopy but the patient would like to perform a Cologuard instead.  Triglycerides are out of control.  Patient will work on diet exercise and weight loss.  Last year his A1c and triglycerides were excellent.  However over the Covid pandemic he admits to switching his diet and becoming sedentary and stopping his Jardiance he leaves those 3 changes led to the lab results seen above.  Screen for prostate cancer with a PSA.  Immunizations are up-to-date.  Patient had a diabetic eye exam 2 months ago.

## 2019-08-15 NOTE — Telephone Encounter (Signed)
-----   Message from Alyson Locket, [place] sent at 08/15/2019  9:18 AM EDT -----  ----- Message ----- From: Alyson Locket, RMA Sent: 08/15/2019   8:45 AM EDT To: Alyson Locket, RMA   ----- Message ----- From: Susy Frizzle, MD Sent: 08/15/2019   8:43 AM EDT To: Alyson Locket, RMA  Schedule cologuard

## 2019-08-15 NOTE — Telephone Encounter (Signed)
Received verbal orders for Cologuard.   Order placed via [facility].   Cologuard (Order HL:[id number])

## 2019-08-16 LAB — CBC WITH DIFFERENTIAL/PLATELET
Absolute Monocytes: 603 cells/uL (ref 200–950)
Basophils Absolute: 52 cells/uL (ref 0–200)
Basophils Relative: 0.9 %
Eosinophils Absolute: 110 cells/uL (ref 15–500)
Eosinophils Relative: 1.9 %
HCT: 50 % (ref 38.5–50.0)
Hemoglobin: 16.3 g/dL (ref 13.2–17.1)
Lymphs Abs: 2436 cells/uL (ref 850–3900)
MCH: 30.5 pg (ref 27.0–33.0)
MCHC: 32.6 g/dL (ref 32.0–36.0)
MCV: 93.6 fL (ref 80.0–100.0)
MPV: 11 fL (ref 7.5–12.5)
Monocytes Relative: 10.4 %
Neutro Abs: 2598 cells/uL (ref 1500–7800)
Neutrophils Relative %: 44.8 %
Platelets: 218 10*3/uL (ref 140–400)
RBC: 5.34 10*6/uL (ref 4.20–5.80)
RDW: 12 % (ref 11.0–15.0)
Total Lymphocyte: 42 %
WBC: 5.8 10*3/uL (ref 3.8–10.8)

## 2019-08-16 LAB — TEST AUTHORIZATION

## 2019-08-16 LAB — COMPREHENSIVE METABOLIC PANEL
AG Ratio: 2.1 (calc) (ref 1.0–2.5)
ALT: 34 U/L (ref 9–46)
AST: 24 U/L (ref 10–35)
Albumin: 4.1 g/dL (ref 3.6–5.1)
Alkaline phosphatase (APISO): 65 U/L (ref 35–144)
BUN/Creatinine Ratio: 16 (calc) (ref 6–22)
BUN: 10 mg/dL (ref 7–25)
CO2: 24 mmol/L (ref 20–32)
Calcium: 8.8 mg/dL (ref 8.6–10.3)
Chloride: 102 mmol/L (ref 98–110)
Creat: 0.61 mg/dL — ABNORMAL LOW (ref 0.70–1.33)
Globulin: 2 g/dL (calc) (ref 1.9–3.7)
Glucose, Bld: 266 mg/dL — ABNORMAL HIGH (ref 65–99)
Potassium: 4 mmol/L (ref 3.5–5.3)
Sodium: 137 mmol/L (ref 135–146)
Total Bilirubin: 0.6 mg/dL (ref 0.2–1.2)
Total Protein: 6.1 g/dL (ref 6.1–8.1)

## 2019-08-16 LAB — HEMOGLOBIN A1C
Hgb A1c MFr Bld: 9.4 % of total Hgb — ABNORMAL HIGH (ref <5.7)
Mean Plasma Glucose: 223 (calc)
eAG (mmol/L): 12.4 (calc)

## 2019-08-16 LAB — LIPID PANEL
Cholesterol: 219 mg/dL — ABNORMAL HIGH (ref <200)
HDL: 23 mg/dL — ABNORMAL LOW (ref >=40)
Non-HDL Cholesterol (Calc): 196 mg/dL (calc) — ABNORMAL HIGH (ref <130)
Total CHOL/HDL Ratio: 9.5 (calc) — ABNORMAL HIGH (ref <5.0)
Triglycerides: 617 mg/dL — ABNORMAL HIGH (ref <150)

## 2019-08-16 LAB — TESTOSTERONE: Testosterone: 125 ng/dL — ABNORMAL LOW (ref 250–827)

## 2019-08-16 LAB — PSA: PSA: 0.2 ng/mL (ref <=4.0)

## 2019-09-19 ENCOUNTER — Other Ambulatory Visit: Payer: Self-pay | Admitting: Family Medicine

## 2019-10-03 ENCOUNTER — Encounter: Payer: Self-pay | Admitting: Family Medicine

## 2019-10-07 ENCOUNTER — Other Ambulatory Visit: Payer: Self-pay | Admitting: Family Medicine

## 2019-10-07 MED ORDER — OZEMPIC (0.25 OR 0.5 MG/DOSE) 2 MG/1.5ML ~~LOC~~ SOPN: 0.5000 mg | PEN_INJECTOR | SUBCUTANEOUS | 5 refills | Status: DC

## 2019-10-26 IMAGING — NM NM HEPATO W/GB/PHARM/[PERSON_NAME]
2 series · 12 of 12 positions shown · non-contrast
Comparison: None.

CLINICAL DATA: Right upper quadrant pain with nausea

EXAM:
NUCLEAR MEDICINE HEPATOBILIARY IMAGING WITH GALLBLADDER EF
VIEWS:
Anterior right upper quadrant
RADIOPHARMACEUTICALS:  5.3 mCi [id number]  Choletec IV

[Series 1: biliary · 3.25mm/px · 6 of 60 frames shown]
[frame 6/60]
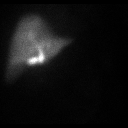
[frame 16/60]
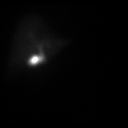
[frame 26/60]
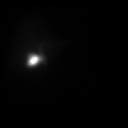
[frame 36/60]
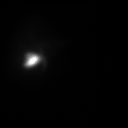
[frame 46/60]
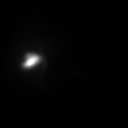
[frame 56/60]
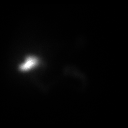

[Series 2: gbef · 3.25mm/px · 6 of 60 frames shown]
[frame 6/60]
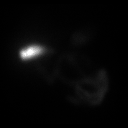
[frame 16/60]
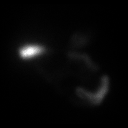
[frame 26/60]
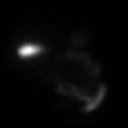
[frame 36/60]
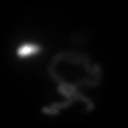
[frame 46/60]
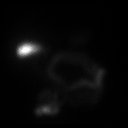
[frame 56/60]
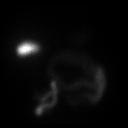

[12 of 12 positions shown; findings below may reference images not displayed]

FINDINGS: Liver uptake of radiotracer is normal. There is prompt visualization
of gallbladder and small bowel, indicating patency of the cystic and
common bile ducts. Patient consumed 8 ounces of Ensure orally with
calculation of the computer generated ejection fraction of
radiotracer from the gallbladder. The patient did not experience
clinical symptoms with the oral Ensure consumption. The computer
generated ejection fraction of radiotracer from the gallbladder is
is abnormally low at 31%, normal greater than 33% using the oral
agent.
IMPRESSION: Diminished ejection fraction of radiotracer from the gallbladder, a
finding indicative of biliary dyskinesia. Patient did not experience
clinical symptoms with the oral Ensure consumption. Cystic and
common bile ducts are patent as is evidenced by visualization of
gallbladder and small bowel.

## 2019-10-27 ENCOUNTER — Other Ambulatory Visit: Payer: Self-pay | Admitting: Family Medicine

## 2019-10-29 ENCOUNTER — Other Ambulatory Visit: Payer: Self-pay | Admitting: Family Medicine

## 2019-10-29 MED ORDER — OZEMPIC (1 MG/DOSE) 2 MG/1.5ML ~~LOC~~ SOPN: 1.0000 mg | PEN_INJECTOR | SUBCUTANEOUS | 3 refills | Status: DC

## 2020-03-04 DIAGNOSIS — J0100 Acute maxillary sinusitis, unspecified: Secondary | ICD-10-CM | POA: Diagnosis not present

## 2020-03-04 DIAGNOSIS — Z20822 Contact with and (suspected) exposure to covid-19: Secondary | ICD-10-CM | POA: Diagnosis not present

## 2020-03-04 DIAGNOSIS — Z03818 Encounter for observation for suspected exposure to other biological agents ruled out: Secondary | ICD-10-CM | POA: Diagnosis not present

## 2020-03-06 ENCOUNTER — Other Ambulatory Visit: Payer: BC Managed Care – PPO

## 2020-03-06 ENCOUNTER — Other Ambulatory Visit: Payer: Self-pay

## 2020-03-06 DIAGNOSIS — E119 Type 2 diabetes mellitus without complications: Secondary | ICD-10-CM

## 2020-03-06 DIAGNOSIS — Z125 Encounter for screening for malignant neoplasm of prostate: Secondary | ICD-10-CM | POA: Diagnosis not present

## 2020-03-06 DIAGNOSIS — E781 Pure hyperglyceridemia: Secondary | ICD-10-CM | POA: Diagnosis not present

## 2020-03-06 DIAGNOSIS — R7989 Other specified abnormal findings of blood chemistry: Secondary | ICD-10-CM

## 2020-03-07 LAB — MICROALBUMIN, URINE: Microalb, Ur: 13.3 mg/dL

## 2020-03-09 LAB — COMPLETE METABOLIC PANEL WITH GFR
AG Ratio: 2 (calc) (ref 1.0–2.5)
ALT: 22 U/L (ref 9–46)
AST: 14 U/L (ref 10–35)
Albumin: 4.4 g/dL (ref 3.6–5.1)
Alkaline phosphatase (APISO): 64 U/L (ref 35–144)
BUN/Creatinine Ratio: 14 (calc) (ref 6–22)
BUN: 8 mg/dL (ref 7–25)
CO2: 28 mmol/L (ref 20–32)
Calcium: 9.4 mg/dL (ref 8.6–10.3)
Chloride: 105 mmol/L (ref 98–110)
Creat: 0.57 mg/dL — ABNORMAL LOW (ref 0.70–1.33)
GFR, Est African American: 133 mL/min/{1.73_m2} (ref >=60)
GFR, Est Non African American: 115 mL/min/{1.73_m2} (ref >=60)
Globulin: 2.2 g/dL (calc) (ref 1.9–3.7)
Glucose, Bld: 149 mg/dL — ABNORMAL HIGH (ref 65–99)
Potassium: 4.1 mmol/L (ref 3.5–5.3)
Sodium: 142 mmol/L (ref 135–146)
Total Bilirubin: 0.5 mg/dL (ref 0.2–1.2)
Total Protein: 6.6 g/dL (ref 6.1–8.1)

## 2020-03-09 LAB — LIPID PANEL
Cholesterol: 213 mg/dL — ABNORMAL HIGH (ref <200)
HDL: 23 mg/dL — ABNORMAL LOW (ref >=40)
Non-HDL Cholesterol (Calc): 190 mg/dL (calc) — ABNORMAL HIGH (ref <130)
Total CHOL/HDL Ratio: 9.3 (calc) — ABNORMAL HIGH (ref <5.0)
Triglycerides: 554 mg/dL — ABNORMAL HIGH (ref <150)

## 2020-03-09 LAB — HEMOGLOBIN A1C
Hgb A1c MFr Bld: 7.4 % of total Hgb — ABNORMAL HIGH (ref <5.7)
Mean Plasma Glucose: 166 mg/dL
eAG (mmol/L): 9.2 mmol/L

## 2020-03-09 LAB — TESTOSTERONE, FREE & TOTAL
Free Testosterone: 32.7 pg/mL — ABNORMAL LOW (ref 35.0–155.0)
Testosterone, Total, LC-MS-MS: 177 ng/dL — ABNORMAL LOW (ref 250–1100)

## 2020-03-10 ENCOUNTER — Other Ambulatory Visit: Payer: Self-pay

## 2020-03-10 ENCOUNTER — Ambulatory Visit: Payer: BC Managed Care – PPO | Admitting: Family Medicine

## 2020-03-10 VITALS — BP 120/78 | HR 83 | Temp 97.5°F | Ht 71.0 in | Wt 263.0 lb

## 2020-03-10 DIAGNOSIS — IMO0002 Reserved for concepts with insufficient information to code with codable children: Secondary | ICD-10-CM

## 2020-03-10 DIAGNOSIS — T466X5A Adverse effect of antihyperlipidemic and antiarteriosclerotic drugs, initial encounter: Secondary | ICD-10-CM

## 2020-03-10 DIAGNOSIS — E118 Type 2 diabetes mellitus with unspecified complications: Secondary | ICD-10-CM

## 2020-03-10 DIAGNOSIS — E1165 Type 2 diabetes mellitus with hyperglycemia: Secondary | ICD-10-CM

## 2020-03-10 DIAGNOSIS — G720 Drug-induced myopathy: Secondary | ICD-10-CM

## 2020-03-10 DIAGNOSIS — E781 Pure hyperglyceridemia: Secondary | ICD-10-CM

## 2020-03-10 MED ORDER — TOUJEO SOLOSTAR 300 UNIT/ML ~~LOC~~ SOPN: 50.0000 [IU] | PEN_INJECTOR | Freq: Two times a day (BID) | SUBCUTANEOUS | 3 refills | Status: DC

## 2020-03-10 MED ORDER — FENOFIBRATE 160 MG PO TABS
160.0000 mg | ORAL_TABLET | Freq: Every day | ORAL | 3 refills | Status: DC
Start: 2020-03-10 — End: 2021-07-12

## 2020-03-10 NOTE — Progress Notes (Signed)
Subjective:    Patient ID: Isaiah Beasley, male    DOB: 28-Nov-1965, 54 y.o.   MRN: [id number]   Patient is a very pleasant 54 year old Caucasian male here today for follow-up of his diabetes.  He is currently on Toujeo 50 units twice daily, Ozempic weekly, Jardiance 25 mg daily, and Metformin 1000 mg twice daily.  He has been able to drop his A1c from 9.4-7.4.  He is exercising on a daily basis through walking at work however he is not adhering to a low carbohydrate diet.  His urine microalbumin to creatinine ratio has risen to 13.  He is not on an ACE inhibitor or an ARB.  He does have some neuropathic pain in his feet but he denies any numbness in diabetic foot exam was performed today and is normal.  Blood pressure today is well controlled at 120/78.  Lab work is listed below and triglycerides are over 500 however he has been out of the fenofibrate for over 2 months.  He denies any chest pain shortness of breath or dyspnea on exertion.  Lab on 03/06/2020  Component Date Value Ref Range Status  . Hgb A1c MFr Bld 03/06/2020 7.4* <5.7 % of total Hgb Final   Comment: For someone without known diabetes, a hemoglobin A1c value of 6.5% or greater indicates that they may have  diabetes and this should be confirmed with a follow-up  test. . For someone with known diabetes, a value <7% indicates  that their diabetes is well controlled and a value  greater than or equal to 7% indicates suboptimal  control. A1c targets should be individualized based on  duration of diabetes, age, comorbid conditions, and  other considerations. . Currently, no consensus exists regarding use of hemoglobin A1c for diagnosis of diabetes for children. .   . Mean Plasma Glucose 03/06/2020 166  mg/dL Final  . eAG (mmol/L) 03/06/2020 9.2  mmol/L Final  . Cholesterol 03/06/2020 213* <200 mg/dL Final  . HDL 03/06/2020 23* > OR = 40 mg/dL Final  . Triglycerides 03/06/2020 554* <150 mg/dL Final   Comment: . If a  non-fasting specimen was collected, consider repeat triglyceride testing on a fasting specimen if clinically indicated.  [name] et al. J. of Clin. Lipidol. 3710;6:269-485. . . There is increased risk of pancreatitis when the  triglyceride concentration is very high  (> or = 500 mg/dL, especially if > or = 1000 mg/dL).  [name] et al. J. of Clin. Lipidol. 4627;0:350-093. .   . LDL Cholesterol (Calc) 03/06/2020   mg/dL (calc) Final   Comment: . LDL cholesterol not calculated. Triglyceride levels greater than 400 mg/dL invalidate calculated LDL results. . Reference range: <100 . Desirable range <100 mg/dL for primary prevention;   <70 mg/dL for patients with CHD or diabetic patients  with > or = 2 CHD risk factors. Marland Kitchen LDL-C is now calculated using the Martin-Hopkins  calculation, which is a validated novel method providing  better accuracy than the Friedewald equation in the  estimation of LDL-C.  Cresenciano Genre et al. Annamaria Helling. 8182;993(71): 2061-2068  (http://education.[facility].com/faq/FAQ164)   . Total CHOL/HDL Ratio 03/06/2020 9.3* <5.0 (calc) Final  . Non-HDL Cholesterol (Calc) 03/06/2020 190* <130 mg/dL (calc) Final   Comment: For patients with diabetes plus 1 major ASCVD risk  factor, treating to a non-HDL-C goal of <100 mg/dL  (LDL-C of <70 mg/dL) is considered a therapeutic  option.   . Glucose, Bld 03/06/2020 149* 65 - 99 mg/dL Final   Comment: .  Fasting reference interval . For someone without known diabetes, a glucose value >125 mg/dL indicates that they may have diabetes and this should be confirmed with a follow-up test. .   . BUN 03/06/2020 8  7 - 25 mg/dL Final  . Creat 03/06/2020 0.57* 0.70 - 1.33 mg/dL Final   Comment: For patients >59 years of age, the reference limit for Creatinine is approximately 13% higher for people identified as African-American. .   . GFR, Est Non African American 03/06/2020 115  > OR = 60 mL/min/1.35m2 Final   . GFR, Est African American 03/06/2020 133  > OR = 60 mL/min/1.10m2 Final  . BUN/Creatinine Ratio 03/06/2020 14  6 - 22 (calc) Final  . Sodium 03/06/2020 142  135 - 146 mmol/L Final  . Potassium 03/06/2020 4.1  3.5 - 5.3 mmol/L Final  . Chloride 03/06/2020 105  98 - 110 mmol/L Final  . CO2 03/06/2020 28  20 - 32 mmol/L Final  . Calcium 03/06/2020 9.4  8.6 - 10.3 mg/dL Final  . Total Protein 03/06/2020 6.6  6.1 - 8.1 g/dL Final  . Albumin 03/06/2020 4.4  3.6 - 5.1 g/dL Final  . Globulin 03/06/2020 2.2  1.9 - 3.7 g/dL (calc) Final  . AG Ratio 03/06/2020 2.0  1.0 - 2.5 (calc) Final  . Total Bilirubin 03/06/2020 0.5  0.2 - 1.2 mg/dL Final  . Alkaline phosphatase (APISO) 03/06/2020 64  35 - 144 U/L Final  . AST 03/06/2020 14  10 - 35 U/L Final  . ALT 03/06/2020 22  9 - 46 U/L Final  . Testosterone, Total, LC-MS-MS 03/06/2020 177* 250 - 1,100 ng/dL Final   Comment: Men with clinically significant hypogonadal symptoms and testosterone values repeatedly in the range of the 200-300 ng/dL or less, may benefit from testerone treatment after adequate risk and benefits counseling. . For additional information, please refer to https://education.[facility].com/faq/FAQ165 (This link is being provided for informational/educational purposes only.) (Note) . This test was developed and its analytical performance characteristics have been determined by [facility]. It has not been cleared or approved by the FDA. This assay has been validated pursuant to the CLIA regulations and is used for clinical purposes. . .   . Free Testosterone 03/06/2020 32.7* 35.0 - 155.0 pg/mL Final   Comment: (Note) This test was developed and its analytical performance  characteristics have been determined by [facility]. It has not been  cleared or approved by the FDA. This assay has been validated  pursuant to the CLIA regulations and is used for clinical purposes. . [facility] [address] [place] [address] [phone] Willette Brace, MD      Past Medical History:  Diagnosis Date  . Diabetes mellitus without complication ([place])   . Hyperlipidemia   . Hypertension   . Wandering (atrial) pacemaker     Current Outpatient Medications on File Prior to Visit  Medication Sig Dispense Refill  . empagliflozin (JARDIANCE) 25 MG TABS tablet Take 25 mg by mouth daily before breakfast. 90 tablet 3  . fenofibrate 160 MG tablet TAKE 1 TABLET BY MOUTH EVERY DAY 90 tablet 3  . glucose blood test strip Check BS bid - tid 100 each 12  . Insulin Pen Needle (PEN NEEDLES 3/16") 31G X 5 MM MISC Use with Victoza and Basaglar pens 300 each 3  . liraglutide (VICTOZA) 18 MG/3ML SOPN INJECT 1.8 MGS SUBCUTANEOUSLY EVERY DAY 3 mL 5  . metFORMIN (GLUCOPHAGE) 1000 MG tablet TAKE 1 TABLET BY  MOUTH TWICE A DAY WITH A MEAL 180 tablet 3  . Omega-3 Fatty Acids (FISH OIL) 1200 MG CAPS Take by mouth.    . Semaglutide, 1 MG/DOSE, (OZEMPIC, 1 MG/DOSE,) 2 MG/1.5ML SOPN Inject 0.75 mLs (1 mg total) into the skin once a week. 12 mL 3  . TOUJEO SOLOSTAR 300 UNIT/ML Solostar Pen INJECT 40 UNITS INTO THE SKIN TWICE A DAY 3 mL 3  . valACYclovir (VALTREX) 500 MG tablet TAKE 1 TABLET BY MOUTH TWICE A DAY 180 tablet 1   No current facility-administered medications on file prior to visit.   No Known Allergies Social History   Socioeconomic History  . Marital status: Married    Spouse name: Not on file  . Number of children: Not on file  . Years of education: Not on file  . Highest education level: Not on file  Occupational History  . Not on file  Tobacco Use  . Smoking status: Never Smoker  . Smokeless tobacco: Never Used  Substance and Sexual Activity  . Alcohol use: Yes    Comment: Rare  . Drug use: No  . Sexual activity: Not on file  Other Topics Concern  . Not on file  Social History Narrative  . Not on file   Social Determinants of Health   Financial Resource Strain: Not on  file  Food Insecurity: Not on file  Transportation Needs: Not on file  Physical Activity: Not on file  Stress: Not on file  Social Connections: Not on file  Intimate Partner Violence: Not on file   No family history on file.    Review of Systems  All other systems reviewed and are negative.      Objective:   Physical Exam Vitals reviewed.  Constitutional:      General: He is not in acute distress.    Appearance: He is well-developed. He is not diaphoretic.  HENT:     Head: Normocephalic and atraumatic.     Right Ear: External ear normal.     Left Ear: External ear normal.     Nose: Nose normal.     Mouth/Throat:     Pharynx: No oropharyngeal exudate.  Eyes:     General: No scleral icterus.       Right eye: No discharge.        Left eye: No discharge.     Conjunctiva/sclera: Conjunctivae normal.     Pupils: Pupils are equal, round, and reactive to light.  Neck:     Thyroid: No thyromegaly.     Vascular: No JVD.     Trachea: No tracheal deviation.  Cardiovascular:     Rate and Rhythm: Normal rate and regular rhythm.     Heart sounds: Normal heart sounds. No murmur heard. No friction rub. No gallop.   Pulmonary:     Effort: Pulmonary effort is normal. No respiratory distress.     Breath sounds: Normal breath sounds. No stridor. No wheezing or rales.  Chest:     Chest wall: No tenderness.  Abdominal:     General: Bowel sounds are normal. There is no distension.     Palpations: Abdomen is soft. There is no mass.     Tenderness: There is no abdominal tenderness. There is no guarding or rebound.  Musculoskeletal:     Cervical back: Normal range of motion and neck supple.  Lymphadenopathy:     Cervical: No cervical adenopathy.  Neurological:     Mental Status: He is alert and oriented  to person, place, and time.     Cranial Nerves: No cranial nerve deficit.     Motor: No abnormal muscle tone.     Coordination: Coordination normal.     Deep Tendon Reflexes:  Reflexes are normal and symmetric.  Psychiatric:        Behavior: Behavior normal.        Thought Content: Thought content normal.        Judgment: Judgment normal.           Assessment & Plan:  DM (diabetes mellitus), type 2, uncontrolled with complications (HCC)  Statin myopathy  Hypertriglyceridemia  I congratulated the patient, his diabetes test is much better however it still not at goal.  We discussed options including adding rapid acting insulin with meals 2-3 times a day.  However the patient would like to try a keto diet and then recheck lab work in 3 to 4 months to see if he can get the A1c down below seven.  At the present time microalbumin to creatinine ratio is less than 30 and given his normal blood pressure I will not add an ACE inhibitor to his Jardiance.  Instead we will focus on controlling his blood sugar better to try to prevent progression of diabetic nephropathy.  Foot exam is normal.  I have asked the patient to resume his fenofibrate and then recheck his cholesterol in 3 months.  Patient has had his flu shot and his Covid vaccine.

## 2020-03-18 ENCOUNTER — Encounter: Payer: Self-pay | Admitting: Family Medicine

## 2020-03-19 ENCOUNTER — Other Ambulatory Visit: Payer: Self-pay

## 2020-03-19 DIAGNOSIS — E1165 Type 2 diabetes mellitus with hyperglycemia: Secondary | ICD-10-CM

## 2020-03-19 DIAGNOSIS — IMO0002 Reserved for concepts with insufficient information to code with codable children: Secondary | ICD-10-CM

## 2020-03-19 MED ORDER — GLUCOSE BLOOD VI STRP: ORAL_STRIP | 12 refills | Status: DC

## 2020-04-10 ENCOUNTER — Encounter: Payer: Self-pay | Admitting: Family Medicine

## 2020-04-10 ENCOUNTER — Other Ambulatory Visit: Payer: Self-pay | Admitting: Family Medicine

## 2020-04-10 DIAGNOSIS — E1165 Type 2 diabetes mellitus with hyperglycemia: Secondary | ICD-10-CM

## 2020-04-10 DIAGNOSIS — IMO0002 Reserved for concepts with insufficient information to code with codable children: Secondary | ICD-10-CM

## 2020-04-10 DIAGNOSIS — E781 Pure hyperglyceridemia: Secondary | ICD-10-CM

## 2020-04-10 DIAGNOSIS — Z8249 Family history of ischemic heart disease and other diseases of the circulatory system: Secondary | ICD-10-CM

## 2020-04-17 ENCOUNTER — Encounter: Payer: Self-pay | Admitting: Cardiology

## 2020-04-17 ENCOUNTER — Other Ambulatory Visit: Payer: Self-pay

## 2020-04-17 ENCOUNTER — Ambulatory Visit (INDEPENDENT_AMBULATORY_CARE_PROVIDER_SITE_OTHER): Payer: BC Managed Care – PPO | Admitting: Cardiology

## 2020-04-17 VITALS — BP 120/80 | HR 80 | Ht 70.0 in | Wt 264.0 lb

## 2020-04-17 DIAGNOSIS — I10 Essential (primary) hypertension: Secondary | ICD-10-CM | POA: Insufficient documentation

## 2020-04-17 DIAGNOSIS — Z8249 Family history of ischemic heart disease and other diseases of the circulatory system: Secondary | ICD-10-CM

## 2020-04-17 DIAGNOSIS — R011 Cardiac murmur, unspecified: Secondary | ICD-10-CM | POA: Diagnosis not present

## 2020-04-17 DIAGNOSIS — E782 Mixed hyperlipidemia: Secondary | ICD-10-CM

## 2020-04-17 NOTE — Patient Instructions (Addendum)
Your physician wants you to follow-up in: [place] will receive a reminder letter in the mail two months in advance. If you don't receive a letter, please call our office to schedule the follow-up appointment.  Your physician recommends that you continue on your current medications as directed. Please refer to the Current Medication list given to you today.  PLEASE CALL AND LET us KNOW WHICH STATIN MEDICATION YOU ARE TAKING   Your physician has requested that you have an echocardiogram. Echocardiography is a painless test that uses sound waves to create images of your heart. It provides your doctor with information about the size and shape of your heart and how well your hearts chambers and valves are working. This procedure takes approximately one hour. There are no restrictions for this procedure.  Thank you for choosing [facility]!!

## 2020-04-17 NOTE — Progress Notes (Signed)
Clinical Summary Isaiah Beasley is a 55 y.o.male seen today as a new consult, referred for the following medical problems.   1. CAD risk assessment  The 2018 ACC/AHA Cholesterol Guideline suggests that coronary artery calcium (CAC) testing may be considered in adults 26-27 years of age without diabetes mellitus and with LDL-C levels ?70 mg/dl-189 mg/dl at a 10-year atherosclerotic cardiovascular disease (ASCVD) risk of ?7.5% to <20% (i.e., intermediate risk group) if a decision about statin therapy is uncertain.1    10 year ASCVD risk is 20.5% CAD risk factors: brother MI 68, father MI 63, DM2, high TGs.  - no recent chest pain, no SOB or DOE - walked 10-15 K steps a day at work at his work [employer], [employer]. Can walk up 3 flights of stairs 5-6 times a day without troubles.  -taking crestor 20mg  3 times a week. Toleratign without troubles. When taking daily has muscle aches.      2. Hyperlipidemia/Elevated triglycerides - just restarted fenofibrate, had been off of.  - working on dietary modification  Past Medical History:  Diagnosis Date  . Diabetes mellitus without complication ([place])   . Hyperlipidemia   . Hypertension   . Wandering (atrial) pacemaker      No Known Allergies   Current Outpatient Medications  Medication Sig Dispense Refill  . empagliflozin (JARDIANCE) 25 MG TABS tablet Take 25 mg by mouth daily before breakfast. 90 tablet 3  . fenofibrate 160 MG tablet Take 1 tablet (160 mg total) by mouth daily. 90 tablet 3  . glucose blood test strip Check BS bid - tid 100 each 12  . insulin glargine, 1 Unit Dial, (TOUJEO SOLOSTAR) 300 UNIT/ML Solostar Pen Inject 50 Units into the skin in the morning and at bedtime. 30 mL 3  . Insulin Pen Needle (PEN NEEDLES 3/16") 31G X 5 MM MISC Use with Victoza and Basaglar pens 300 each 3  . metFORMIN (GLUCOPHAGE) 1000 MG tablet TAKE 1 TABLET BY MOUTH TWICE A DAY WITH A MEAL 180 tablet 3  . Omega-3 Fatty Acids (FISH OIL) 1200 MG CAPS Take by  mouth.    . Semaglutide, 1 MG/DOSE, (OZEMPIC, 1 MG/DOSE,) 2 MG/1.5ML SOPN Inject 0.75 mLs (1 mg total) into the skin once a week. 12 mL 3  . valACYclovir (VALTREX) 500 MG tablet TAKE 1 TABLET BY MOUTH TWICE A DAY 180 tablet 1   No current facility-administered medications for this visit.        No Known Allergies    Brother and father with MI late 72s. Sister with a congenital heart valve problem, unsure of details    Social History Mr. Vieth reports that he has never smoked. He has never used smokeless tobacco. Mr. Nicklaus reports current alcohol use.   Review of Systems CONSTITUTIONAL: No weight loss, fever, chills, weakness or fatigue.  HEENT: Eyes: No visual loss, blurred vision, double vision or yellow sclerae.No hearing loss, sneezing, congestion, runny nose or sore throat.  SKIN: No rash or itching.  CARDIOVASCULAR: per hpi RESPIRATORY: No shortness of breath, cough or sputum.  GASTROINTESTINAL: No anorexia, nausea, vomiting or diarrhea. No abdominal pain or blood.  GENITOURINARY: No burning on urination, no polyuria NEUROLOGICAL: No headache, dizziness, syncope, paralysis, ataxia, numbness or tingling in the extremities. No change in bowel or bladder control.  MUSCULOSKELETAL: No muscle, back pain, joint pain or stiffness.  LYMPHATICS: No enlarged nodes. No history of splenectomy.  PSYCHIATRIC: No history of depression or anxiety.  ENDOCRINOLOGIC: No reports of  sweating, cold or heat intolerance. No polyuria or polydipsia.  Marland Kitchen   Physical Examination  Gen: resting comfortably, no acute distress HEENT: no scleral icterus, pupils equal round and reactive, no palptable cervical adenopathy,  CV:RRR, 3/6 systolic murmur rusb no jvd Resp: Clear to auscultation bilaterally GI: abdomen is soft, non-tender, non-distended, normal bowel sounds, no hepatosplenomegaly MSK: extremities are warm, no edema.  Skin: warm, no rash Neuro:  no focal deficits Psych: appropriate  affect     Assessment and Plan  1. CAD risk factor assessment/Family history of heart disease - no current symptoms - continue aggressive risk factor modification - he reports he is taking a statin at home, will call us with the info - no indication for "screening" ischemic testing  2. Heart murmur - obtain echo - reports sister with history of a congenital valve problem had to have a valve replacement, he will let us know more info  3. Hyperlipidemia - continue fenofibrate, has been off at time of last lipid panel with TGs were high - he will call us with his home statin info. Given his DM2 should be on statin regardless of LDL.    4. PACs - EKG SR, PAC with compensatory pause - no symptoms - monitor at this time.  F/u 6 months.   Arnoldo Lenis, M.D.

## 2020-04-20 ENCOUNTER — Other Ambulatory Visit: Payer: Self-pay | Admitting: *Deleted

## 2020-04-20 MED ORDER — ATORVASTATIN CALCIUM 10 MG PO TABS: 10.0000 mg | ORAL_TABLET | ORAL | 1 refills | Status: DC

## 2020-05-04 ENCOUNTER — Encounter: Payer: Self-pay | Admitting: Family Medicine

## 2020-05-04 MED ORDER — VALACYCLOVIR HCL 500 MG PO TABS: 500.0000 mg | ORAL_TABLET | Freq: Two times a day (BID) | ORAL | 1 refills | Status: DC

## 2020-05-05 ENCOUNTER — Ambulatory Visit (INDEPENDENT_AMBULATORY_CARE_PROVIDER_SITE_OTHER): Payer: BC Managed Care – PPO

## 2020-05-05 DIAGNOSIS — R011 Cardiac murmur, unspecified: Secondary | ICD-10-CM | POA: Diagnosis not present

## 2020-05-05 LAB — ECHOCARDIOGRAM COMPLETE
AR max vel: 1.24 cm2
AV Area VTI: 1.42 cm2
AV Area mean vel: 1.16 cm2
AV Mean grad: 16 mmHg
AV Peak grad: 26.8 mmHg
Ao pk vel: 2.59 m/s
Area-P 1/2: 3.6 cm2
Calc EF: 62 %
S' Lateral: 3.18 cm
Single Plane A2C EF: 58.7 %
Single Plane A4C EF: 63.3 %

## 2020-05-09 ENCOUNTER — Encounter: Payer: Self-pay | Admitting: Family Medicine

## 2020-05-11 MED ORDER — TOUJEO SOLOSTAR 300 UNIT/ML ~~LOC~~ SOPN: 50.0000 [IU] | PEN_INJECTOR | Freq: Two times a day (BID) | SUBCUTANEOUS | 3 refills | Status: DC

## 2020-05-13 ENCOUNTER — Telehealth: Payer: Self-pay | Admitting: *Deleted

## 2020-05-13 DIAGNOSIS — Z8249 Family history of ischemic heart disease and other diseases of the circulatory system: Secondary | ICD-10-CM

## 2020-05-13 NOTE — Telephone Encounter (Signed)
-----   Message from Arnoldo Lenis, MD sent at 05/12/2020  7:20 AM EST ----- Echo shows he has a bicuspid aortic valve. This means this valve instead of having 3 flaps only has 2, about 2-3% of the population are born with this. This type of valve tends to wear out sooner, however his valve only shows mild to moderate stiffness at this time which is just something to monitor. Sometimes these patients are more likely to have aortic aneurysms as well, can we order a CTA dissection protcol to further evaluate his aorta.   Carlyle Dolly MD

## 2020-05-15 NOTE — Telephone Encounter (Signed)
Patient informed and verbalized understanding of plan. 

## 2020-05-15 NOTE — Telephone Encounter (Signed)
New message     Patient returned call for results

## 2020-05-21 ENCOUNTER — Other Ambulatory Visit: Payer: Self-pay | Admitting: *Deleted

## 2020-05-21 DIAGNOSIS — I10 Essential (primary) hypertension: Secondary | ICD-10-CM

## 2020-05-21 NOTE — Progress Notes (Signed)
BMP

## 2020-05-29 ENCOUNTER — Telehealth: Payer: Self-pay | Admitting: Cardiology

## 2020-05-29 ENCOUNTER — Other Ambulatory Visit: Payer: Self-pay | Admitting: Family Medicine

## 2020-05-29 NOTE — Telephone Encounter (Signed)
Pt called in returning [place] call, best number to contact him is his cell number [phone]  I made this his primary number on his chart

## 2020-05-29 NOTE — Telephone Encounter (Signed)
Pt aware order in epic may go to [facility] and have done Per pt is going to [facility]

## 2020-05-29 NOTE — Telephone Encounter (Signed)
Patient called stating that he was told he needs lab work done before his upcoming test set for 06/11/2020. He will be going to [facility] for testing.

## 2020-05-29 NOTE — Telephone Encounter (Signed)
Lm to call back ./cy 

## 2020-06-03 ENCOUNTER — Telehealth: Payer: Self-pay | Admitting: Cardiology

## 2020-06-03 NOTE — Telephone Encounter (Signed)
Isaiah Beasley from [facility] states the patient is requesting his CT be done at [place] instead, because it is more affordable. She states the only thing that needs to be done is have the order faxed to: [phone]. She states the authorization will stay the same and she does not need a call back.

## 2020-06-03 NOTE — Telephone Encounter (Signed)
Order faxed via Epic

## 2020-06-11 ENCOUNTER — Ambulatory Visit (HOSPITAL_COMMUNITY): Payer: BC Managed Care – PPO

## 2020-06-11 DIAGNOSIS — E118 Type 2 diabetes mellitus with unspecified complications: Secondary | ICD-10-CM | POA: Diagnosis not present

## 2020-06-22 ENCOUNTER — Other Ambulatory Visit: Payer: Self-pay

## 2020-06-22 ENCOUNTER — Ambulatory Visit
Admission: RE | Admit: 2020-06-22 | Discharge: 2020-06-22 | Disposition: A | Discharge status: Home / Self Care | Payer: BC Managed Care – PPO | Source: Ambulatory Visit | Attending: Cardiology | Admitting: Cardiology

## 2020-06-22 DIAGNOSIS — R599 Enlarged lymph nodes, unspecified: Secondary | ICD-10-CM | POA: Diagnosis not present

## 2020-06-22 DIAGNOSIS — Z8249 Family history of ischemic heart disease and other diseases of the circulatory system: Secondary | ICD-10-CM

## 2020-06-22 MED ORDER — IOPAMIDOL (ISOVUE-370) INJECTION 76%
75.0000 mL | Freq: Once | INTRAVENOUS | Status: AC | PRN
  Administered 2020-06-22: 75 mL via INTRAVENOUS

## 2020-07-21 ENCOUNTER — Other Ambulatory Visit: Payer: BC Managed Care – PPO

## 2020-07-21 ENCOUNTER — Other Ambulatory Visit: Payer: Self-pay

## 2020-07-21 DIAGNOSIS — R7989 Other specified abnormal findings of blood chemistry: Secondary | ICD-10-CM

## 2020-07-21 DIAGNOSIS — E1165 Type 2 diabetes mellitus with hyperglycemia: Secondary | ICD-10-CM | POA: Diagnosis not present

## 2020-07-21 DIAGNOSIS — E781 Pure hyperglyceridemia: Secondary | ICD-10-CM | POA: Diagnosis not present

## 2020-07-21 DIAGNOSIS — E118 Type 2 diabetes mellitus with unspecified complications: Secondary | ICD-10-CM | POA: Diagnosis not present

## 2020-07-21 DIAGNOSIS — IMO0002 Reserved for concepts with insufficient information to code with codable children: Secondary | ICD-10-CM

## 2020-07-22 LAB — COMPLETE METABOLIC PANEL WITH GFR
AG Ratio: 2.2 (calc) (ref 1.0–2.5)
ALT: 17 U/L (ref 9–46)
AST: 16 U/L (ref 10–35)
Albumin: 4.4 g/dL (ref 3.6–5.1)
Alkaline phosphatase (APISO): 52 U/L (ref 35–144)
BUN/Creatinine Ratio: 20 (calc) (ref 6–22)
BUN: 10 mg/dL (ref 7–25)
CO2: 23 mmol/L (ref 20–32)
Calcium: 9.2 mg/dL (ref 8.6–10.3)
Chloride: 106 mmol/L (ref 98–110)
Creat: 0.5 mg/dL — ABNORMAL LOW (ref 0.70–1.33)
GFR, Est African American: 140 mL/min/{1.73_m2} (ref >=60)
GFR, Est Non African American: 121 mL/min/{1.73_m2} (ref >=60)
Globulin: 2 g/dL (calc) (ref 1.9–3.7)
Glucose, Bld: 123 mg/dL — ABNORMAL HIGH (ref 65–99)
Potassium: 3.9 mmol/L (ref 3.5–5.3)
Sodium: 142 mmol/L (ref 135–146)
Total Bilirubin: 0.4 mg/dL (ref 0.2–1.2)
Total Protein: 6.4 g/dL (ref 6.1–8.1)

## 2020-07-22 LAB — HEMOGLOBIN A1C
Hgb A1c MFr Bld: 6.2 %{Hb} — ABNORMAL HIGH
Mean Plasma Glucose: 131 mg/dL
eAG (mmol/L): 7.3 mmol/L

## 2020-07-22 LAB — LIPID PANEL
Cholesterol: 147 mg/dL
HDL: 28 mg/dL — ABNORMAL LOW
LDL Cholesterol (Calc): 91 mg/dL
Non-HDL Cholesterol (Calc): 119 mg/dL
Total CHOL/HDL Ratio: 5.3 (calc) — ABNORMAL HIGH
Triglycerides: 182 mg/dL — ABNORMAL HIGH

## 2020-07-22 LAB — TSH: TSH: 1.22 mIU/L (ref 0.40–4.50)

## 2020-07-22 LAB — EXTRA URINE SPECIMEN

## 2020-07-23 ENCOUNTER — Ambulatory Visit (INDEPENDENT_AMBULATORY_CARE_PROVIDER_SITE_OTHER): Payer: BC Managed Care – PPO | Admitting: Family Medicine

## 2020-07-23 ENCOUNTER — Other Ambulatory Visit: Payer: Self-pay

## 2020-07-23 ENCOUNTER — Encounter: Payer: Self-pay | Admitting: Family Medicine

## 2020-07-23 VITALS — BP 132/80 | HR 88 | Temp 98.0°F | Resp 14 | Ht 70.0 in | Wt 258.0 lb

## 2020-07-23 DIAGNOSIS — E7800 Pure hypercholesterolemia, unspecified: Secondary | ICD-10-CM | POA: Diagnosis not present

## 2020-07-23 DIAGNOSIS — Z794 Long term (current) use of insulin: Secondary | ICD-10-CM

## 2020-07-23 DIAGNOSIS — E118 Type 2 diabetes mellitus with unspecified complications: Secondary | ICD-10-CM

## 2020-07-23 DIAGNOSIS — E781 Pure hyperglyceridemia: Secondary | ICD-10-CM

## 2020-07-23 NOTE — Progress Notes (Signed)
Subjective:    Patient ID: Isaiah Beasley, male    DOB: 04-01-1965, 55 y.o.   MRN: [id number]   Since I last saw the patient, he has changed his diet completely.  He is now following a keto diet.  He has lost from over 280 pounds last fall to 258 pounds.  His A1c has dropped precipitously to 6.2 which is outstanding.  He has been able to reduce his Toujeo from 100 units to 18 units and he is still having occasional episodes of hypoglycemia at night.  I recommended that he decrease his Toujeo to 10 units and then if he can continues to see low sugars discontinue it altogether.  He denies any chest pain shortness of breath or dyspnea on exertion.  He has reduced his triglycerides from over 500 to under 200.  He is continue to maintain his LDL cholesterol less than 100.  His HDL cholesterol has risen slightly from 22-28.  All of these numbers are near his all-time best.  Therefore I gave the patient a [place] congratulations as he is obviously made an effort to make changes. Lab on 07/21/2020  Component Date Value Ref Range Status  . Hgb A1c MFr Bld 07/21/2020 6.2* <5.7 % of total Hgb Final   Comment: For someone without known diabetes, a hemoglobin  A1c value between 5.7% and 6.4% is consistent with prediabetes and should be confirmed with a  follow-up test. . For someone with known diabetes, a value <7% indicates that their diabetes is well controlled. A1c targets should be individualized based on duration of diabetes, age, comorbid conditions, and other considerations. . This assay result is consistent with an increased risk of diabetes. . Currently, no consensus exists regarding use of hemoglobin A1c for diagnosis of diabetes for children. .   . Mean Plasma Glucose 07/21/2020 131  mg/dL Final  . eAG (mmol/L) 07/21/2020 7.3  mmol/L Final  . Glucose, Bld 07/21/2020 123* 65 - 99 mg/dL Final   Comment: .            Fasting reference interval . For someone without known diabetes, a  glucose value between 100 and 125 mg/dL is consistent with prediabetes and should be confirmed with a follow-up test. .   . BUN 07/21/2020 10  7 - 25 mg/dL Final  . Creat 07/21/2020 0.50* 0.70 - 1.33 mg/dL Final   Comment: For patients >58 years of age, the reference limit for Creatinine is approximately 13% higher for people identified as African-American. .   . GFR, Est Non African American 07/21/2020 121  > OR = 60 mL/min/1.85m2 Final  . GFR, Est African American 07/21/2020 140  > OR = 60 mL/min/1.28m2 Final  . BUN/Creatinine Ratio 07/21/2020 20  6 - 22 (calc) Final  . Sodium 07/21/2020 142  135 - 146 mmol/L Final  . Potassium 07/21/2020 3.9  3.5 - 5.3 mmol/L Final  . Chloride 07/21/2020 106  98 - 110 mmol/L Final  . CO2 07/21/2020 23  20 - 32 mmol/L Final  . Calcium 07/21/2020 9.2  8.6 - 10.3 mg/dL Final  . Total Protein 07/21/2020 6.4  6.1 - 8.1 g/dL Final  . Albumin 07/21/2020 4.4  3.6 - 5.1 g/dL Final  . Globulin 07/21/2020 2.0  1.9 - 3.7 g/dL (calc) Final  . AG Ratio 07/21/2020 2.2  1.0 - 2.5 (calc) Final  . Total Bilirubin 07/21/2020 0.4  0.2 - 1.2 mg/dL Final  . Alkaline phosphatase (APISO) 07/21/2020 52  35 - 144 U/L  Final  . AST 07/21/2020 16  10 - 35 U/L Final  . ALT 07/21/2020 17  9 - 46 U/L Final  . Cholesterol 07/21/2020 147  <200 mg/dL Final  . HDL 07/21/2020 28* > OR = 40 mg/dL Final  . Triglycerides 07/21/2020 182* <150 mg/dL Final  . LDL Cholesterol (Calc) 07/21/2020 91  mg/dL (calc) Final   Comment: Reference range: <100 . Desirable range <100 mg/dL for primary prevention;   <70 mg/dL for patients with CHD or diabetic patients  with > or = 2 CHD risk factors. Marland Kitchen LDL-C is now calculated using the Martin-Hopkins  calculation, which is a validated novel method providing  better accuracy than the Friedewald equation in the  estimation of LDL-C.  Cresenciano Genre et al. Annamaria Helling. 3212;248(25): 2061-2068  (http://education.[facility].com/faq/FAQ164)   . Total  CHOL/HDL Ratio 07/21/2020 5.3* <5.0 (calc) Final  . Non-HDL Cholesterol (Calc) 07/21/2020 119  <130 mg/dL (calc) Final   Comment: For patients with diabetes plus 1 major ASCVD risk  factor, treating to a non-HDL-C goal of <100 mg/dL  (LDL-C of <70 mg/dL) is considered a therapeutic  option.   . TSH 07/21/2020 1.22  0.40 - 4.50 mIU/L Final  . Extra Urine Specimen 07/21/2020    Final   Comment: We received an extra specimen with no test requested. If any test is desired for this specimen please call client services and advise.      Past Medical History:  Diagnosis Date  . Diabetes mellitus without complication ([place])   . Hyperlipidemia   . Wandering (atrial) pacemaker     Current Outpatient Medications on File Prior to Visit  Medication Sig Dispense Refill  . aspirin EC 81 MG tablet Take 81 mg by mouth daily. Swallow whole.    Marland Kitchen atorvastatin (LIPITOR) 10 MG tablet Take 1 tablet (10 mg total) by mouth 3 (three) times a week. 36 tablet 1  . empagliflozin (JARDIANCE) 25 MG TABS tablet Take 25 mg by mouth daily before breakfast. 90 tablet 3  . fenofibrate 160 MG tablet Take 1 tablet (160 mg total) by mouth daily. 90 tablet 3  . glucose blood test strip Check BS bid - tid 100 each 12  . insulin glargine, 1 Unit Dial, (TOUJEO SOLOSTAR) 300 UNIT/ML Solostar Pen Inject 50 Units into the skin in the morning and at bedtime. (Patient taking differently: Inject into the skin in the morning and at bedtime. 10u Q AM, 8u Q PM) 30 mL 3  . Insulin Pen Needle (PEN NEEDLES 3/16") 31G X 5 MM MISC Use with Victoza and Basaglar pens 300 each 3  . metFORMIN (GLUCOPHAGE) 1000 MG tablet TAKE 1 TABLET BY MOUTH TWICE A DAY WITH A MEAL 180 tablet 3  . Omega-3 Fatty Acids (FISH OIL) 1200 MG CAPS Take by mouth.    . Semaglutide, 1 MG/DOSE, (OZEMPIC, 1 MG/DOSE,) 2 MG/1.5ML SOPN Inject 0.75 mLs (1 mg total) into the skin once a week. 12 mL 3  . valACYclovir (VALTREX) 500 MG tablet Take 1 tablet (500 mg total) by  mouth 2 (two) times daily. 180 tablet 1   No current facility-administered medications on file prior to visit.   No Known Allergies Social History   Socioeconomic History  . Marital status: Married    Spouse name: Not on file  . Number of children: Not on file  . Years of education: Not on file  . Highest education level: Not on file  Occupational History  . Not on file  Tobacco  Use  . Smoking status: Never Smoker  . Smokeless tobacco: Never Used  Substance and Sexual Activity  . Alcohol use: Yes    Comment: Rare  . Drug use: No  . Sexual activity: Not on file  Other Topics Concern  . Not on file  Social History Narrative  . Not on file   Social Determinants of Health   Financial Resource Strain: Not on file  Food Insecurity: Not on file  Transportation Needs: Not on file  Physical Activity: Not on file  Stress: Not on file  Social Connections: Not on file  Intimate Partner Violence: Not on file   Family History  Problem Relation Age of Onset  . Heart attack Father 11  . Hyperlipidemia Father   . Diabetes Father   . Heart failure Father   . CAD Father        stent placement  . Diabetes Sister   . Hyperlipidemia Sister   . Valvular heart disease Sister        surgically corrected  . Heart attack Brother 51  . Hyperlipidemia Brother   . Hypertension Brother   . CAD Brother        stent placement  . Heart attack Paternal Grandmother   . Hyperlipidemia Brother       Review of Systems  All other systems reviewed and are negative.      Objective:   Physical Exam Vitals reviewed.  Constitutional:      General: He is not in acute distress.    Appearance: He is well-developed. He is not diaphoretic.  HENT:     Head: Normocephalic and atraumatic.     Right Ear: External ear normal.     Left Ear: External ear normal.     Nose: Nose normal.     Mouth/Throat:     Pharynx: No oropharyngeal exudate.  Eyes:     General: No scleral icterus.       Right  eye: No discharge.        Left eye: No discharge.     Conjunctiva/sclera: Conjunctivae normal.     Pupils: Pupils are equal, round, and reactive to light.  Neck:     Thyroid: No thyromegaly.     Vascular: No JVD.     Trachea: No tracheal deviation.  Cardiovascular:     Rate and Rhythm: Normal rate and regular rhythm.     Heart sounds: Normal heart sounds. No murmur heard. No friction rub. No gallop.   Pulmonary:     Effort: Pulmonary effort is normal. No respiratory distress.     Breath sounds: Normal breath sounds. No stridor. No wheezing or rales.  Chest:     Chest wall: No tenderness.  Abdominal:     General: Bowel sounds are normal. There is no distension.     Palpations: Abdomen is soft. There is no mass.     Tenderness: There is no abdominal tenderness. There is no guarding or rebound.  Musculoskeletal:     Cervical back: Normal range of motion and neck supple.  Lymphadenopathy:     Cervical: No cervical adenopathy.  Neurological:     Mental Status: He is alert and oriented to person, place, and time.     Cranial Nerves: No cranial nerve deficit.     Motor: No abnormal muscle tone.     Coordination: Coordination normal.     Deep Tendon Reflexes: Reflexes are normal and symmetric.  Psychiatric:  Behavior: Behavior normal.        Thought Content: Thought content normal.        Judgment: Judgment normal.           Assessment & Plan:  Controlled type 2 diabetes mellitus with complication, with long-term current use of insulin (HCC)  Hypertriglyceridemia  Pure hypercholesterolemia  Blood sugar today is outstanding.  Recommended he reduce his Toujeo to 10 units and if he continues to see well-controlled blood sugars discontinue insulin altogether.  Congratulated the patient on the changes he has made in his diet and weight loss.  Encouraged him to try to increase his aerobic exercise to 30 minutes a day 5 days a week and also increase the intensity of his  exercise.  Blood pressure is well controlled at 132/80.  I am extremely happy to see his triglycerides under 200 which is very difficult for this patient.  His HDL remains low however this is obviously a genetic issue for this patient and he is doing the best he can.  I believe that if he increases the frequency and intensity of his aerobic exercise and gets his weight near 230 pounds, his HDL cholesterol will raise above 30 which would be a dramatic accomplishment for this patient.  Initially he was 17 when I first started seeing the patient.  Encouraged him to keep up the good work.

## 2020-08-24 ENCOUNTER — Encounter: Payer: Self-pay | Admitting: Family Medicine

## 2020-09-15 MED ORDER — EZETIMIBE 10 MG PO TABS: 10.0000 mg | ORAL_TABLET | Freq: Every day | ORAL | 3 refills | Status: DC

## 2020-10-01 ENCOUNTER — Other Ambulatory Visit: Payer: Self-pay | Admitting: Cardiology

## 2020-10-05 ENCOUNTER — Other Ambulatory Visit: Payer: Self-pay | Admitting: Family Medicine

## 2020-10-14 ENCOUNTER — Ambulatory Visit: Payer: BC Managed Care – PPO | Admitting: Cardiology

## 2020-10-14 ENCOUNTER — Encounter: Payer: Self-pay | Admitting: Cardiology

## 2020-10-14 VITALS — BP 118/80 | HR 88 | Ht 70.0 in | Wt 265.2 lb

## 2020-10-14 DIAGNOSIS — I350 Nonrheumatic aortic (valve) stenosis: Secondary | ICD-10-CM

## 2020-10-14 DIAGNOSIS — E785 Hyperlipidemia, unspecified: Secondary | ICD-10-CM

## 2020-10-14 NOTE — Progress Notes (Signed)
Clinical Summary Mr. Benassi is a 55 y.o.male seen for a focused visit for his history of aortic stenosis.     Prior visit 1. CAD risk assessment   The 2018 ACC/AHA Cholesterol Guideline suggests that coronary artery calcium (CAC) testing may be considered in adults 57-72 years of age without diabetes mellitus and with LDL-C levels ?70 mg/dl-189 mg/dl at a 10-year atherosclerotic cardiovascular disease (ASCVD) risk of ?7.5% to <20% (i.e., intermediate risk group) if a decision about statin therapy is uncertain.1      10 year ASCVD risk is 20.5% CAD risk factors: brother MI 71, father MI 33, DM2, high TGs.   - no recent chest pain, no SOB or DOE - walked 10-15 K steps a day at work at his work [employer], [employer]. Can walk up 3 flights of stairs 5-6 times a day without troubles. -taking crestor 20mg  3 times a week. Toleratign without troubles. When taking daily has muscle aches.         2. Hyperlipidemia/Elevated triglycerides - just restarted fenofibrate, had been off of. - working on dietary modification     Current visit   3. Aortic stenosis/Bicuspid AV - 04/2020 echo LVEF 60-65%, bicuspid AV with mild to mod AS mean grad 16, DI 0.37, AVA VTI 1.4 - 05/2020 CTA: aortic root 4 cm,. Ascending aorta 3.5 cm - sister has had bicuspid valve, AVR. - no recent symptoms    Past Medical History:  Diagnosis Date   Diabetes mellitus without complication (HCC)    Hyperlipidemia    Wandering (atrial) pacemaker      No Known Allergies   Current Outpatient Medications  Medication Sig Dispense Refill   aspirin EC 81 MG tablet Take 81 mg by mouth daily. Swallow whole.     atorvastatin (LIPITOR) 10 MG tablet TAKE 1 TABLET BY MOUTH 3 TIMES A WEEK. 36 tablet 1   empagliflozin (JARDIANCE) 25 MG TABS tablet Take 25 mg by mouth daily before breakfast. 90 tablet 3   ezetimibe (ZETIA) 10 MG tablet Take 1 tablet (10 mg total) by mouth daily. 90 tablet 3   fenofibrate 160 MG tablet Take 1 tablet  (160 mg total) by mouth daily. 90 tablet 3   glucose blood test strip Check BS bid - tid 100 each 12   insulin glargine, 1 Unit Dial, (TOUJEO SOLOSTAR) 300 UNIT/ML Solostar Pen Inject 50 Units into the skin in the morning and at bedtime. (Patient taking differently: Inject into the skin in the morning and at bedtime. 10u Q AM, 8u Q PM) 30 mL 3   Insulin Pen Needle (PEN NEEDLES 3/16") 31G X 5 MM MISC Use with Victoza and Basaglar pens 300 each 3   metFORMIN (GLUCOPHAGE) 1000 MG tablet TAKE 1 TABLET BY MOUTH TWICE A DAY WITH A MEAL 180 tablet 3   Omega-3 Fatty Acids (FISH OIL) 1200 MG CAPS Take by mouth.     OZEMPIC, 1 MG/DOSE, 4 MG/3ML SOPN INJECT 1 MG INTO THE SKIN ONCE A WEEK. 12 mL 3   valACYclovir (VALTREX) 500 MG tablet Take 1 tablet (500 mg total) by mouth 2 (two) times daily. 180 tablet 1   No current facility-administered medications for this visit.     No past surgical history on file.   No Known Allergies    Family History  Problem Relation Age of Onset   Heart attack Father 29   Hyperlipidemia Father    Diabetes Father    Heart failure Father  CAD Father        stent placement   Diabetes Sister    Hyperlipidemia Sister    Valvular heart disease Sister        surgically corrected   Heart attack Brother 69   Hyperlipidemia Brother    Hypertension Brother    CAD Brother        stent placement   Heart attack Paternal Grandmother    Hyperlipidemia Brother      Social History Mr. Fleck reports that he has never smoked. He has never used smokeless tobacco. Mr. Kina reports current alcohol use.   Review of Systems CONSTITUTIONAL: No weight loss, fever, chills, weakness or fatigue.  HEENT: Eyes: No visual loss, blurred vision, double vision or yellow sclerae.No hearing loss, sneezing, congestion, runny nose or sore throat.  SKIN: No rash or itching.  CARDIOVASCULAR: per hpi RESPIRATORY: No shortness of breath, cough or sputum.  GASTROINTESTINAL: No  anorexia, nausea, vomiting or diarrhea. No abdominal pain or blood.  GENITOURINARY: No burning on urination, no polyuria NEUROLOGICAL: No headache, dizziness, syncope, paralysis, ataxia, numbness or tingling in the extremities. No change in bowel or bladder control.  MUSCULOSKELETAL: No muscle, back pain, joint pain or stiffness.  LYMPHATICS: No enlarged nodes. No history of splenectomy.  PSYCHIATRIC: No history of depression or anxiety.  ENDOCRINOLOGIC: No reports of sweating, cold or heat intolerance. No polyuria or polydipsia.  Marland Kitchen   Physical Examination Today's Vitals   10/14/20 0957  BP: 118/80  Pulse: 88  SpO2: 97%  Weight: 265 lb 3.2 oz (120.3 kg)  Height: 5\' 10"  (1.778 m)   Body mass index is 38.05 kg/m.  Gen: resting comfortably, no acute distress HEENT: no scleral icterus, pupils equal round and reactive, no palptable cervical adenopathy,  CV: RRR, no m/r/g, no jvd Resp: Clear to auscultation bilaterally GI: abdomen is soft, non-tender, non-distended, normal bowel sounds, no hepatosplenomegaly MSK: extremities are warm, no edema.  Skin: warm, no rash Neuro:  no focal deficits Psych: appropriate affect   Diagnostic Studies  04/2020 echo 1. Left ventricular ejection fraction, by estimation, is 60 to 65%. The  left ventricle has normal function. The left ventricle has no regional  wall motion abnormalities. There is moderate left ventricular hypertrophy.  Left ventricular diastolic  parameters are indeterminate.   2. Right ventricular systolic function is normal. The right ventricular  size is normal. Tricuspid regurgitation signal is inadequate for assessing  PA pressure.   3. The mitral valve is grossly normal. Trivial mitral valve  regurgitation.   4. The aortic valve is bicuspid. There is moderate calcification of the  aortic valve. Aortic valve regurgitation is trivial. Mild to moderate  aortic valve stenosis. Aortic valve mean gradient measures 16.0 mmHg.   Dimentionless index 0.37.   5. Aortic dilatation noted. There is borderline dilatation of the aortic  root, measuring 39 mm.   6. The inferior vena cava is normal in size with greater than 50%  respiratory variability, suggesting right atrial pressure of 3 mmHg.    Assessment and Plan     1. Aortic stenosis/bicuspid AV - recent echo with bicuspid AV, mild to mod AS - CTA without significant aortopathy - repeat echo 04/2021   2. Hyperlipidemia - difficultly tolerating statin, now off - pcp started zetia, he reports started to have muscle aches on zetia - has had issues with high TGs, has been on fenofibrate. - diabetic patient with over 20% 10 year ASCVD risk. Multiple siblings and father with  MI in there 25s.  - given difficulties in management will refer to lipid clinic       Arnoldo Lenis, M.D.

## 2020-10-14 NOTE — Patient Instructions (Addendum)
Medication Instructions:  Continue all current medications.  Labwork: none  Testing/Procedures: Your physician has requested that you have an echocardiogram. Echocardiography is a painless test that uses sound waves to create images of your heart. It provides your doctor with information about the size and shape of your heart and how well your heart's chambers and valves are working. This procedure takes approximately one hour. There are no restrictions for this procedure - DUE IN FEB 2023  Follow-Up: FEB 2023  Any Other Special Instructions Will Be Listed Below (If Applicable). You have been referred to:  Lipid Clinic  If you need a refill on your cardiac medications before your next appointment, please call your pharmacy.

## 2020-10-18 ENCOUNTER — Other Ambulatory Visit: Payer: Self-pay | Admitting: Family Medicine

## 2020-10-20 ENCOUNTER — Other Ambulatory Visit: Payer: Self-pay

## 2020-10-20 ENCOUNTER — Ambulatory Visit (INDEPENDENT_AMBULATORY_CARE_PROVIDER_SITE_OTHER): Payer: BC Managed Care – PPO | Admitting: Pharmacist

## 2020-10-20 VITALS — BP 130/74 | HR 95 | Resp 17 | Ht 70.0 in | Wt 268.0 lb

## 2020-10-20 DIAGNOSIS — E781 Pure hyperglyceridemia: Secondary | ICD-10-CM

## 2020-10-20 DIAGNOSIS — E785 Hyperlipidemia, unspecified: Secondary | ICD-10-CM

## 2020-10-20 NOTE — Patient Instructions (Signed)
It was nice meeting you today!  We would like your LDL (bad cholesterol) to be less than 70 and your triglycerides to be less than 150  Continue your fenofibrate '160mg'$  daily  We will start a new medication called Repatha which you will inject once every 2 weeks  You will get a call when the prior authorization is approved  Please repeat your cholesterol panel in about 2-3 months  Please call with any questions!  Karren Cobble, PharmD, BCACP, [place], [place] [id number], [place], [place] [address] Phone: [phone]; Fax: [phone] 10/20/2020 3:08 PM

## 2020-10-20 NOTE — Progress Notes (Signed)
Patient ID: Isaiah Beasley                 DOB: Mar 20, 1966                    MRN: [id number]     HPI: Isaiah Beasley is a 55 y.o. male patient referred to lipid clinic by Dr Harl Bowie. PMH is significant for HLD, aortic valve stenosis, DM, and statin intolerance.  Patient has a strong family history of CAD.  Patient has trialed and failed multiple statins including pitavastatin and atorvastatin.  Both called severe muscle pain, so severe he could not put on socks.  Was recently started on Zetia '10mg'$  and is having myalgias again but is trying to fight through them.  Taking fenofibrate for triglycerides.  Trigs typically greater than 500 if not treated with fibrates.    Patient presents today in good spirits. Most recent labs showed excellent glucose with A1c 6.2%.  Is working on cutting down on carbohydrates in his diet. Does not smoke. Drinks rarely.    Brother (14 months older) and sister (31 years older) both recently had MI.  Brother in months ol good physical shape.  Father has a history of CAD as well.    Current Medications: Zetia '10mg'$ , fenofribate '160mg'$  daily Intolerances: atorvastatin, pitavastatin Risk Factors: DM, CAD, family history, low HDL  Labs:  HDL 28, Trigs 182, TC 147, LDL 91 (07/21/20 on Zetia)  Past Medical History:  Diagnosis Date   Diabetes mellitus without complication ([place])    Hyperlipidemia    Wandering (atrial) pacemaker     Current Outpatient Medications on File Prior to Visit  Medication Sig Dispense Refill   aspirin EC 81 MG tablet Take 81 mg by mouth daily. Swallow whole.     ezetimibe (ZETIA) 10 MG tablet Take 1 tablet (10 mg total) by mouth daily. 90 tablet 3   fenofibrate 160 MG tablet Take 1 tablet (160 mg total) by mouth daily. 90 tablet 3   glucose blood test strip Check BS bid - tid 100 each 12   insulin glargine, 1 Unit Dial, (TOUJEO SOLOSTAR) 300 UNIT/ML Solostar Pen Inject 50 Units into the skin in the morning and at bedtime. (Patient taking  differently: Inject into the skin in the morning and at bedtime. 10u Q AM, 8u Q PM) 30 mL 3   Insulin Pen Needle (PEN NEEDLES 3/16") 31G X 5 MM MISC Use with Victoza and Basaglar pens 300 each 3   JARDIANCE 25 MG TABS tablet TAKE 1 TABLET BY MOUTH DAILY BEFORE BREAKFAST. 90 tablet 3   metFORMIN (GLUCOPHAGE) 1000 MG tablet TAKE 1 TABLET BY MOUTH TWICE A DAY WITH A MEAL 180 tablet 3   Omega-3 Fatty Acids (FISH OIL) 1200 MG CAPS Take by mouth.     OZEMPIC, 1 MG/DOSE, 4 MG/3ML SOPN INJECT 1 MG INTO THE SKIN ONCE A WEEK. 12 mL 3   valACYclovir (VALTREX) 500 MG tablet TAKE 1 TABLET BY MOUTH TWICE A DAY 180 tablet 1   No current facility-administered medications on file prior to visit.    No Known Allergies  Assessment/Plan:  1. Hyperlipidemia - Patient current LDL 91 on Zetia 10 mg which is above goal of <70.  Would strive for <55 due to patient's family history and DM.    Discussed next pharmacologic steps with patient including PCSK9i.  Using demo pen, discussed storage, site selection, administration, and possible adverse effects.  Patient voiced understanding.  Will complete PA.  Continue fenofibrate '160mg'$  daily Start Repatha '140mg'$  q 14 d Recheck lipid panel in 2-3 months  Karren Cobble, PharmD, BCACP, [place], [place] [id number], [place], [place] [address] Phone: [phone]; Fax: [phone] 10/20/2020 5:38 PM

## 2020-10-22 ENCOUNTER — Telehealth: Payer: Self-pay

## 2020-10-22 DIAGNOSIS — I10 Essential (primary) hypertension: Secondary | ICD-10-CM

## 2020-10-22 DIAGNOSIS — E781 Pure hyperglyceridemia: Secondary | ICD-10-CM

## 2020-10-22 MED ORDER — REPATHA SURECLICK 140 MG/ML ~~LOC~~ SOAJ: 140.0000 mg | SUBCUTANEOUS | 11 refills | Status: DC

## 2020-10-22 NOTE — Telephone Encounter (Signed)
Called and spoke w/pt regarding the approval of repatha, rx sent, pt instructed to complete fasting lipids post 4th dose, pt also instructed to call Call RepathaReady at [phone]) to see if you qualify for the [place] because I tried to complete it but they need more information. Pt voiced understanding

## 2020-12-24 ENCOUNTER — Encounter: Payer: Self-pay | Admitting: Family Medicine

## 2020-12-28 MED ORDER — "PEN NEEDLES 3/16"" 31G X 5 MM MISC": 3 refills | Status: AC

## 2021-01-10 ENCOUNTER — Encounter: Payer: Self-pay | Admitting: Family Medicine

## 2021-01-10 DIAGNOSIS — E7800 Pure hypercholesterolemia, unspecified: Secondary | ICD-10-CM

## 2021-01-10 DIAGNOSIS — Z794 Long term (current) use of insulin: Secondary | ICD-10-CM

## 2021-01-10 DIAGNOSIS — Z13220 Encounter for screening for lipoid disorders: Secondary | ICD-10-CM

## 2021-01-10 DIAGNOSIS — Z0000 Encounter for general adult medical examination without abnormal findings: Secondary | ICD-10-CM

## 2021-01-10 DIAGNOSIS — E781 Pure hyperglyceridemia: Secondary | ICD-10-CM

## 2021-01-10 DIAGNOSIS — E118 Type 2 diabetes mellitus with unspecified complications: Secondary | ICD-10-CM

## 2021-01-10 DIAGNOSIS — Z136 Encounter for screening for cardiovascular disorders: Secondary | ICD-10-CM

## 2021-01-11 ENCOUNTER — Other Ambulatory Visit: Payer: Self-pay | Admitting: *Deleted

## 2021-01-11 DIAGNOSIS — Z13220 Encounter for screening for lipoid disorders: Secondary | ICD-10-CM

## 2021-01-11 DIAGNOSIS — Z794 Long term (current) use of insulin: Secondary | ICD-10-CM

## 2021-01-11 DIAGNOSIS — E781 Pure hyperglyceridemia: Secondary | ICD-10-CM

## 2021-01-11 DIAGNOSIS — E7800 Pure hypercholesterolemia, unspecified: Secondary | ICD-10-CM

## 2021-01-11 DIAGNOSIS — Z0000 Encounter for general adult medical examination without abnormal findings: Secondary | ICD-10-CM

## 2021-01-11 DIAGNOSIS — E118 Type 2 diabetes mellitus with unspecified complications: Secondary | ICD-10-CM

## 2021-01-12 DIAGNOSIS — E781 Pure hyperglyceridemia: Secondary | ICD-10-CM | POA: Diagnosis not present

## 2021-01-12 DIAGNOSIS — E7800 Pure hypercholesterolemia, unspecified: Secondary | ICD-10-CM | POA: Diagnosis not present

## 2021-01-12 DIAGNOSIS — Z13220 Encounter for screening for lipoid disorders: Secondary | ICD-10-CM | POA: Diagnosis not present

## 2021-01-12 DIAGNOSIS — Z794 Long term (current) use of insulin: Secondary | ICD-10-CM | POA: Diagnosis not present

## 2021-01-12 DIAGNOSIS — E118 Type 2 diabetes mellitus with unspecified complications: Secondary | ICD-10-CM | POA: Diagnosis not present

## 2021-01-15 ENCOUNTER — Ambulatory Visit: Payer: BC Managed Care – PPO | Admitting: Family Medicine

## 2021-01-19 ENCOUNTER — Ambulatory Visit: Payer: BC Managed Care – PPO | Admitting: Family Medicine

## 2021-01-19 ENCOUNTER — Other Ambulatory Visit: Payer: Self-pay

## 2021-01-19 VITALS — BP 132/80 | HR 76 | Temp 97.6°F | Wt 269.0 lb

## 2021-01-19 DIAGNOSIS — E781 Pure hyperglyceridemia: Secondary | ICD-10-CM | POA: Diagnosis not present

## 2021-01-19 DIAGNOSIS — E119 Type 2 diabetes mellitus without complications: Secondary | ICD-10-CM | POA: Diagnosis not present

## 2021-01-19 DIAGNOSIS — E7800 Pure hypercholesterolemia, unspecified: Secondary | ICD-10-CM

## 2021-01-19 DIAGNOSIS — Z794 Long term (current) use of insulin: Secondary | ICD-10-CM | POA: Diagnosis not present

## 2021-01-19 DIAGNOSIS — E118 Type 2 diabetes mellitus with unspecified complications: Secondary | ICD-10-CM

## 2021-01-19 NOTE — Progress Notes (Signed)
Subjective:    Patient ID: Isaiah Beasley, male    DOB: 01-30-1966, 55 y.o.   MRN: [id number]  Patient recently had lab work checked at [facility].  Creatinine was 0.61.  Liver function tests were normal.  CBC was completely normal.  Total cholesterol was 83, LDL cholesterol is 26, triglycerides were 150.  A1c was 7.0.  We have not repeated a urine microalbumin since December of last year when it was 13.  Overall the patient has been doing well.  He has a history of a bicuspid aortic valve with mild to moderate aortic stenosis.  He denies any chest pain shortness of breath or dyspnea on exertion.  He denies any neuropathy in his feet.  He denies any abdominal pain.  He denies any myalgias on Repatha.  His cholesterol is outstanding.  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  He has an appointment to see cardiology in April of next year to monitor this.  Overall he is doing well with no concerns  Past Medical History:  Diagnosis Date   Diabetes mellitus without complication ([place])    Hyperlipidemia    Wandering (atrial) pacemaker     Current Outpatient Medications on File Prior to Visit  Medication Sig Dispense Refill   aspirin EC 81 MG tablet Take 81 mg by mouth daily. Swallow whole.     Evolocumab (REPATHA SURECLICK) 149 MG/ML SOAJ Inject 140 mg into the skin every 14 (fourteen) days. 2 mL 11   fenofibrate 160 MG tablet Take 1 tablet (160 mg total) by mouth daily. 90 tablet 3   JARDIANCE 25 MG TABS tablet TAKE 1 TABLET BY MOUTH DAILY BEFORE BREAKFAST. 90 tablet 3   metFORMIN (GLUCOPHAGE) 1000 MG tablet TAKE 1 TABLET BY MOUTH TWICE A DAY WITH A MEAL 180 tablet 3   Omega-3 Fatty Acids (FISH OIL) 1200 MG CAPS Take by mouth in the morning and at bedtime.     OZEMPIC, 1 MG/DOSE, 4 MG/3ML SOPN INJECT 1 MG INTO THE SKIN ONCE A WEEK. 12 mL 3   glucose blood test strip Check BS bid - tid 100 each 12   insulin glargine, 1 Unit Dial, (TOUJEO SOLOSTAR) 300  UNIT/ML Solostar Pen Inject 50 Units into the skin in the morning and at bedtime. (Patient taking differently: Inject into the skin in the morning and at bedtime. 50 units bid) 30 mL 3   Insulin Pen Needle (PEN NEEDLES 3/16") 31G X 5 MM MISC Use as directed to inject Toujeo SQ 2x daily and Ozempic SQ QW. 300 each 3   valACYclovir (VALTREX) 500 MG tablet TAKE 1 TABLET BY MOUTH TWICE A DAY (Patient not taking: Reported on 01/19/2021) 180 tablet 1   No current facility-administered medications on file prior to visit.   Allergies  Allergen Reactions   Lipitor [Atorvastatin]     myalgias   Pitavastatin     myalgias   Zetia [Ezetimibe]     myalgias   Social History   Socioeconomic History   Marital status: Married    Spouse name: Not on file   Number of children: Not on file   Years of education: Not on file   Highest education level: Not on file  Occupational History   Not on file  Tobacco Use   Smoking status: Never   Smokeless tobacco: Never  Substance and Sexual Activity   Alcohol use: Yes    Comment: Rare  Drug use: No   Sexual activity: Not on file  Other Topics Concern   Not on file  Social History Narrative   Not on file   Social Determinants of Health   Financial Resource Strain: Not on file  Food Insecurity: Not on file  Transportation Needs: Not on file  Physical Activity: Not on file  Stress: Not on file  Social Connections: Not on file  Intimate Partner Violence: Not on file   Family History  Problem Relation Age of Onset   Heart attack Father 46   Hyperlipidemia Father    Diabetes Father    Heart failure Father    CAD Father        stent placement   Diabetes Sister    Hyperlipidemia Sister    Valvular heart disease Sister        surgically corrected   Heart attack Brother 54   Hyperlipidemia Brother    Hypertension Brother    CAD Brother        stent placement   Heart attack Paternal Grandmother    Hyperlipidemia Brother       Review of  Systems  All other systems reviewed and are negative.     Objective:   Physical Exam Vitals reviewed.  Constitutional:      General: He is not in acute distress.    Appearance: He is well-developed. He is not diaphoretic.  HENT:     Head: Normocephalic and atraumatic.     Right Ear: External ear normal.     Left Ear: External ear normal.     Nose: Nose normal.     Mouth/Throat:     Pharynx: No oropharyngeal exudate.  Eyes:     General: No scleral icterus.       Right eye: No discharge.        Left eye: No discharge.     Conjunctiva/sclera: Conjunctivae normal.     Pupils: Pupils are equal, round, and reactive to light.  Neck:     Thyroid: No thyromegaly.     Vascular: No JVD.     Trachea: No tracheal deviation.  Cardiovascular:     Rate and Rhythm: Normal rate and regular rhythm.     Heart sounds: Normal heart sounds. No murmur heard.   No friction rub. No gallop.  Pulmonary:     Effort: Pulmonary effort is normal. No respiratory distress.     Breath sounds: Normal breath sounds. No stridor. No wheezing or rales.  Chest:     Chest wall: No tenderness.  Abdominal:     General: Bowel sounds are normal. There is no distension.     Palpations: Abdomen is soft. There is no mass.     Tenderness: There is no abdominal tenderness. There is no guarding or rebound.  Musculoskeletal:     Cervical back: Normal range of motion and neck supple.  Lymphadenopathy:     Cervical: No cervical adenopathy.  Neurological:     Mental Status: He is alert and oriented to person, place, and time.     Cranial Nerves: No cranial nerve deficit.     Motor: No abnormal muscle tone.     Coordination: Coordination normal.     Deep Tendon Reflexes: Reflexes are normal and symmetric.  Psychiatric:        Behavior: Behavior normal.        Thought Content: Thought content normal.        Judgment: Judgment normal.  Assessment & Plan:  Controlled type 2 diabetes mellitus with  complication, with long-term current use of insulin (HCC)  Hypertriglyceridemia  Pure hypercholesterolemia I am extremely happy with his blood pressure and his cholesterol.  At his next opportunity I recommended that he check a urine microalbumin to ensure that there is no diabetic nephropathy.  If elevated above 30 I would recommend adding an ACE inhibitor.  Continue to work on diet exercise and weight loss.  A1c is mildly elevated at 7.0 but I would encourage lifestyle changes to achieve a goal A1c of less than 6.5.

## 2021-03-09 ENCOUNTER — Other Ambulatory Visit: Payer: Self-pay | Admitting: Family Medicine

## 2021-03-09 DIAGNOSIS — E118 Type 2 diabetes mellitus with unspecified complications: Secondary | ICD-10-CM

## 2021-04-13 ENCOUNTER — Other Ambulatory Visit: Payer: Self-pay | Admitting: Family Medicine

## 2021-04-21 ENCOUNTER — Other Ambulatory Visit: Payer: Self-pay | Admitting: Pharmacist

## 2021-04-21 ENCOUNTER — Telehealth: Payer: Self-pay | Admitting: Pharmacist

## 2021-04-21 ENCOUNTER — Other Ambulatory Visit: Payer: Self-pay | Admitting: *Deleted

## 2021-04-21 DIAGNOSIS — E781 Pure hyperglyceridemia: Secondary | ICD-10-CM

## 2021-04-21 DIAGNOSIS — E785 Hyperlipidemia, unspecified: Secondary | ICD-10-CM

## 2021-04-21 NOTE — Telephone Encounter (Signed)
Patient is at [facility]. Was advised to have lipid panel done. Lab tech cannot see C. Pavero RPH order from 10/20/20 for lipid panel. Reordered so that patient can have done.

## 2021-04-22 LAB — LIPID PANEL
Chol/HDL Ratio: 2.9 ratio (ref 0.0–5.0)
Cholesterol, Total: 81 mg/dL — ABNORMAL LOW (ref 100–199)
HDL: 28 mg/dL — ABNORMAL LOW (ref >39)
LDL Chol Calc (NIH): 24 mg/dL (ref 0–99)
Triglycerides: 178 mg/dL — ABNORMAL HIGH (ref 0–149)
VLDL Cholesterol Cal: 29 mg/dL (ref 5–40)

## 2021-04-22 LAB — HEPATIC FUNCTION PANEL
ALT: 24 IU/L (ref 0–44)
AST: 19 IU/L (ref 0–40)
Albumin: 4.6 g/dL (ref 3.8–4.9)
Alkaline Phosphatase: 66 IU/L (ref 44–121)
Bilirubin Total: 0.4 mg/dL (ref 0.0–1.2)
Bilirubin, Direct: 0.13 mg/dL (ref 0.00–0.40)
Total Protein: 6.5 g/dL (ref 6.0–8.5)

## 2021-04-28 ENCOUNTER — Ambulatory Visit (HOSPITAL_COMMUNITY): Payer: BC Managed Care – PPO

## 2021-04-28 ENCOUNTER — Encounter: Payer: Self-pay | Admitting: Cardiology

## 2021-05-09 ENCOUNTER — Encounter: Payer: Self-pay | Admitting: Pharmacist

## 2021-05-10 ENCOUNTER — Ambulatory Visit: Payer: BC Managed Care – PPO | Admitting: Cardiology

## 2021-05-10 ENCOUNTER — Telehealth: Payer: Self-pay | Admitting: *Deleted

## 2021-05-10 ENCOUNTER — Encounter: Payer: Self-pay | Admitting: *Deleted

## 2021-05-10 NOTE — Telephone Encounter (Signed)
Laurine Blazer, LPN  04/29/5425  0:62 PM EST Back to Top    Patient notified via letter.  Copy to pcp.

## 2021-05-10 NOTE — Telephone Encounter (Signed)
-----   Message from Arnoldo Lenis, MD sent at 05/09/2021  9:40 AM EST ----- Normal liver tests  Zandra Abts MD

## 2021-05-18 ENCOUNTER — Ambulatory Visit (HOSPITAL_COMMUNITY)
Admission: RE | Admit: 2021-05-18 | Discharge: 2021-05-18 | Disposition: A | Discharge status: Home / Self Care | Payer: BC Managed Care – PPO | Source: Ambulatory Visit | Attending: Cardiology | Admitting: Cardiology

## 2021-05-18 DIAGNOSIS — I350 Nonrheumatic aortic (valve) stenosis: Secondary | ICD-10-CM | POA: Insufficient documentation

## 2021-05-18 DIAGNOSIS — I351 Nonrheumatic aortic (valve) insufficiency: Secondary | ICD-10-CM | POA: Diagnosis not present

## 2021-05-18 LAB — ECHOCARDIOGRAM COMPLETE
AR max vel: 1.15 cm2
AV Area VTI: 1.4 cm2
AV Area mean vel: 1.04 cm2
AV Mean grad: 20 mmHg
AV Peak grad: 31.4 mmHg
Ao pk vel: 2.8 m/s
Area-P 1/2: 2.42 cm2
S' Lateral: 2.5 cm

## 2021-05-18 NOTE — Progress Notes (Signed)
*  PRELIMINARY RESULTS* Echocardiogram 2D Echocardiogram has been performed.  Samuel Germany 05/18/2021, 9:31 AM

## 2021-05-24 ENCOUNTER — Telehealth: Payer: Self-pay | Admitting: *Deleted

## 2021-05-24 ENCOUNTER — Telehealth: Payer: Self-pay

## 2021-05-24 MED ORDER — METFORMIN HCL 1000 MG PO TABS: 1000.0000 mg | ORAL_TABLET | Freq: Two times a day (BID) | ORAL | 3 refills | Status: DC

## 2021-05-24 NOTE — Telephone Encounter (Signed)
Pharmacy faxed a request for a refill of   metFORMIN (GLUCOPHAGE) 1000 MG tablet [[id number]]    Order Details Dose, Route, Frequency: As Directed  Dispense Quantity: 180 tablet Refills: 3        Sig: TAKE 1 TABLET BY MOUTH TWICE A DAY WITH A MEAL

## 2021-05-24 NOTE — Telephone Encounter (Signed)
Refill sent to pharmacy.   

## 2021-05-24 NOTE — Telephone Encounter (Signed)
Laurine Blazer, LPN  07/08/8206  1:38 PM EST Back to Top    Notified, copy to pcp.

## 2021-05-24 NOTE — Telephone Encounter (Signed)
-----   Message from Arnoldo Lenis, MD sent at 05/23/2021 10:07 AM EST ----- Echo shows aortic valve has mild leak and is moderately stiff. Not a concern at this time just something to continue to monitor  Isaiah Abts MD

## 2021-06-13 ENCOUNTER — Other Ambulatory Visit: Payer: Self-pay | Admitting: Family Medicine

## 2021-06-13 ENCOUNTER — Encounter: Payer: Self-pay | Admitting: Family Medicine

## 2021-06-14 ENCOUNTER — Telehealth: Payer: Self-pay

## 2021-06-14 NOTE — Telephone Encounter (Signed)
Fax received from [employer] with form to complete for patient to receive Dexcom 7 meter. ? ?Spoke with patient and he did request this. ? ?Form completed and given to provider for signature.  ?

## 2021-06-21 ENCOUNTER — Encounter: Payer: Self-pay | Admitting: Family Medicine

## 2021-06-24 ENCOUNTER — Other Ambulatory Visit: Payer: Self-pay | Admitting: Family Medicine

## 2021-06-24 DIAGNOSIS — Z1211 Encounter for screening for malignant neoplasm of colon: Secondary | ICD-10-CM

## 2021-07-09 ENCOUNTER — Ambulatory Visit: Payer: BC Managed Care – PPO | Admitting: Cardiology

## 2021-07-09 ENCOUNTER — Encounter: Payer: Self-pay | Admitting: Cardiology

## 2021-07-09 VITALS — BP 110/64 | HR 84 | Ht 70.0 in | Wt 270.8 lb

## 2021-07-09 DIAGNOSIS — E782 Mixed hyperlipidemia: Secondary | ICD-10-CM

## 2021-07-09 DIAGNOSIS — I350 Nonrheumatic aortic (valve) stenosis: Secondary | ICD-10-CM

## 2021-07-09 NOTE — Patient Instructions (Signed)
Medication Instructions:  ?Continue all current medications. ? ?Labwork: ?none ? ?Testing/Procedures: ?Your physician has requested that you have an echocardiogram. Echocardiography is a painless test that uses sound waves to create images of your heart. It provides your doctor with information about the size and shape of your heart and how well your heart?s chambers and valves are working. This procedure takes approximately one hour. There are no restrictions for this procedure - DUE IN 1 YEAR ? ?Follow-Up: ? Your physician wants you to follow up in:  1 year.  You should receive a call from the office when due.  If you don't receive this call, please call our office to schedule the follow up appointment   ? ?Any Other Special Instructions Will Be Listed Below (If Applicable). ? ? ?If you need a refill on your cardiac medications before your next appointment, please call your pharmacy. ? ?

## 2021-07-09 NOTE — Progress Notes (Signed)
? ? ? ?Clinical Summary ?Mr. Isaiah Beasley is a 56 y.o.male seen today for follow up of the following medical problems.  ? ?1. Aortic stenosis/Bicuspid AV ?- 04/2020 echo LVEF 60-65%, bicuspid AV with mild to mod AS mean grad 16, DI 0.37, AVA VTI 1.4 ?- 05/2020 CTA: aortic root 4 cm,. Ascending aorta 3.5 cm ?- sister has had bicuspid valve, AVR. ?- no recent symptoms ? ?04/2021 echo LVEF >75%, grade I dd, mild AI, mod AS AVA VTI 1.4 mean grad 20. Aortic root 4 cm.  ? ? ?2. Hyperlipidemia ?- did not tolerate zetia or statins ?- seen in lipid clinic ?- he is currently on fenofibrate and repatha ? ?- Jan 2023 TC 81 TG 178 HDL 28 LDL 24 ? ? ?Past Medical History:  ?Diagnosis Date  ? Diabetes mellitus without complication ([place])   ? Hyperlipidemia   ? Wandering (atrial) pacemaker   ? ? ? ?Allergies  ?Allergen Reactions  ? Lipitor [Atorvastatin]   ?  myalgias  ? Pitavastatin   ?  myalgias  ? Zetia [Ezetimibe]   ?  myalgias  ? ? ? ?Current Outpatient Medications  ?Medication Sig Dispense Refill  ? ONETOUCH VERIO test strip CHECK BLOOD SUGAR 2-3 TIMES DAILY 300 strip 4  ? aspirin EC 81 MG tablet Take 81 mg by mouth daily. Swallow whole.    ? Evolocumab (REPATHA SURECLICK) 706 MG/ML SOAJ Inject 140 mg into the skin every 14 (fourteen) days. 2 mL 11  ? fenofibrate 160 MG tablet Take 1 tablet (160 mg total) by mouth daily. 90 tablet 3  ? Insulin Pen Needle (PEN NEEDLES 3/16") 31G X 5 MM MISC Use as directed to inject Toujeo SQ 2x daily and Ozempic SQ QW. 300 each 3  ? JARDIANCE 25 MG TABS tablet TAKE 1 TABLET BY MOUTH DAILY BEFORE BREAKFAST. 90 tablet 3  ? metFORMIN (GLUCOPHAGE) 1000 MG tablet Take 1 tablet (1,000 mg total) by mouth 2 (two) times daily with a meal. 180 tablet 3  ? Omega-3 Fatty Acids (FISH OIL) 1200 MG CAPS Take by mouth in the morning and at bedtime.    ? OZEMPIC, 1 MG/DOSE, 4 MG/3ML SOPN INJECT 1 MG INTO THE SKIN ONCE A WEEK. 12 mL 3  ? TOUJEO SOLOSTAR 300 UNIT/ML Solostar Pen INJECT 50 UNITS INTO THE SKIN IN THE  MORNING AND AT BEDTIME. 9 mL 1  ? valACYclovir (VALTREX) 500 MG tablet TAKE 1 TABLET BY MOUTH TWICE A DAY 180 tablet 1  ? ?No current facility-administered medications for this visit.  ? ? ? ?No past surgical history on file. ? ? ?Allergies  ?Allergen Reactions  ? Lipitor [Atorvastatin]   ?  myalgias  ? Pitavastatin   ?  myalgias  ? Zetia [Ezetimibe]   ?  myalgias  ? ? ? ? ?Family History  ?Problem Relation Age of Onset  ? Heart attack Father 25  ? Hyperlipidemia Father   ? Diabetes Father   ? Heart failure Father   ? CAD Father   ?     stent placement  ? Diabetes Sister   ? Hyperlipidemia Sister   ? Valvular heart disease Sister   ?     surgically corrected  ? Heart attack Brother 30  ? Hyperlipidemia Brother   ? Hypertension Brother   ? CAD Brother   ?     stent placement  ? Heart attack Paternal Grandmother   ? Hyperlipidemia Brother   ? ? ? ?Social History ?Mr. Isaiah Beasley reports  that he has never smoked. He has never used smokeless tobacco. ?Mr. Isaiah Beasley reports current alcohol use. ? ? ?Review of Systems ?CONSTITUTIONAL: No weight loss, fever, chills, weakness or fatigue.  ?HEENT: Eyes: No visual loss, blurred vision, double vision or yellow sclerae.No hearing loss, sneezing, congestion, runny nose or sore throat.  ?SKIN: No rash or itching.  ?CARDIOVASCULAR: no chest pain, no palpitaitons ?RESPIRATORY: No shortness of breath, cough or sputum.  ?GASTROINTESTINAL: No anorexia, nausea, vomiting or diarrhea. No abdominal pain or blood.  ?GENITOURINARY: No burning on urination, no polyuria ?NEUROLOGICAL: No headache, dizziness, syncope, paralysis, ataxia, numbness or tingling in the extremities. No change in bowel or bladder control.  ?MUSCULOSKELETAL: No muscle, back pain, joint pain or stiffness.  ?LYMPHATICS: No enlarged nodes. No history of splenectomy.  ?PSYCHIATRIC: No history of depression or anxiety.  ?ENDOCRINOLOGIC: No reports of sweating, cold or heat intolerance. No polyuria or polydipsia.   ?. ? ? ?Physical Examination ?Today's Vitals  ? 07/09/21 1247  ?BP: 110/64  ?Pulse: 84  ?SpO2: 96%  ?Weight: 270 lb 12.8 oz (122.8 kg)  ?Height: '5\' 10"'$  (1.778 m)  ? ?Body mass index is 38.86 kg/m?. ? ?Gen: resting comfortably, no acute distress ?HEENT: no scleral icterus, pupils equal round and reactive, no palptable cervical adenopathy,  ?CV: RRR, 3/6 systolic murmur rusb, no jvd ?Resp: Clear to auscultation bilaterally ?GI: abdomen is soft, non-tender, non-distended, normal bowel sounds, no hepatosplenomegaly ?MSK: extremities are warm, no edema.  ?Skin: warm, no rash ?Neuro:  no focal deficits ?Psych: appropriate affect ? ? ?Diagnostic Studies ?04/2020 echo ?1. Left ventricular ejection fraction, by estimation, is 60 to 65%. The  ?left ventricle has normal function. The left ventricle has no regional  ?wall motion abnormalities. There is moderate left ventricular hypertrophy.  ?Left ventricular diastolic  ?parameters are indeterminate.  ? 2. Right ventricular systolic function is normal. The right ventricular  ?size is normal. Tricuspid regurgitation signal is inadequate for assessing  ?PA pressure.  ? 3. The mitral valve is grossly normal. Trivial mitral valve  ?regurgitation.  ? 4. The aortic valve is bicuspid. There is moderate calcification of the  ?aortic valve. Aortic valve regurgitation is trivial. Mild to moderate  ?aortic valve stenosis. Aortic valve mean gradient measures 16.0 mmHg.  ?Dimentionless index 0.37.  ? 5. Aortic dilatation noted. There is borderline dilatation of the aortic  ?root, measuring 39 mm.  ? 6. The inferior vena cava is normal in size with greater than 50%  ?respiratory variability, suggesting right atrial pressure of 3 mmHg.  ? ? ? ?Assessment and Plan  ? ?1. Aortic stenosis/bicuspid AV ?- most recent echo moderate AS ?- CTA without significant aortopathy ?- continue to monitor, repeat echo 1 year ?  ? ?2. Hyperlipidemia ?- did not tolerate statins or zetia ?- on fenofibrate and  repatha, LDL at goal.  ?- continue current meds ? ?EKG today shows NSR ? ? ?Echo in 1 year, f/u clinic few weeks after ? ? ?Arnoldo Lenis, M.D. ?

## 2021-07-11 ENCOUNTER — Encounter: Payer: Self-pay | Admitting: Family Medicine

## 2021-07-12 ENCOUNTER — Other Ambulatory Visit: Payer: Self-pay

## 2021-07-12 MED ORDER — FENOFIBRATE 160 MG PO TABS: 160.0000 mg | ORAL_TABLET | Freq: Every day | ORAL | 3 refills | Status: AC

## 2021-07-12 NOTE — Progress Notes (Signed)
Rx has been sent to pharmacy

## 2021-07-13 ENCOUNTER — Encounter: Payer: Self-pay | Admitting: Gastroenterology

## 2021-07-13 ENCOUNTER — Telehealth: Payer: Self-pay | Admitting: Gastroenterology

## 2021-07-16 ENCOUNTER — Ambulatory Visit (AMBULATORY_SURGERY_CENTER): Payer: BC Managed Care – PPO | Admitting: *Deleted

## 2021-07-16 VITALS — Ht 71.0 in | Wt 260.0 lb

## 2021-07-16 DIAGNOSIS — Z1211 Encounter for screening for malignant neoplasm of colon: Secondary | ICD-10-CM

## 2021-07-16 MED ORDER — NA SULFATE-K SULFATE-MG SULF 17.5-3.13-1.6 GM/177ML PO SOLN: 1.0000 | Freq: Once | ORAL | 0 refills | Status: AC

## 2021-07-16 NOTE — Progress Notes (Signed)

## 2021-08-06 ENCOUNTER — Encounter: Payer: Self-pay | Admitting: Gastroenterology

## 2021-08-13 ENCOUNTER — Telehealth: Payer: Self-pay | Admitting: Gastroenterology

## 2021-08-13 ENCOUNTER — Encounter: Payer: BC Managed Care – PPO | Admitting: Gastroenterology

## 2021-08-13 NOTE — Telephone Encounter (Signed)
Good morning, Patient called this morning to cancel appointment. Patient states that when he woke up this morning he was not feeling well enough to come into office for procedure. Patient would like to reschedule procedure for a later date.

## 2021-08-13 NOTE — Telephone Encounter (Signed)
Please call the patient back and reschedule his colonoscopy.

## 2021-09-08 ENCOUNTER — Other Ambulatory Visit: Payer: Self-pay | Admitting: Cardiology

## 2021-09-10 ENCOUNTER — Other Ambulatory Visit: Payer: Self-pay

## 2021-09-10 ENCOUNTER — Telehealth: Payer: Self-pay | Admitting: Gastroenterology

## 2021-09-10 MED ORDER — NA SULFATE-K SULFATE-MG SULF 17.5-3.13-1.6 GM/177ML PO SOLN: 1.0000 | Freq: Once | ORAL | 0 refills | Status: AC

## 2021-09-10 NOTE — Telephone Encounter (Signed)
Patient called and moved his procedure up to 6/21.  He will need his prep called into his pharmacy.  Thank you.

## 2021-09-10 NOTE — Telephone Encounter (Signed)
Suprep has been sent to patient pharmacy.

## 2021-09-15 ENCOUNTER — Encounter: Payer: Self-pay | Admitting: Gastroenterology

## 2021-09-15 ENCOUNTER — Ambulatory Visit (AMBULATORY_SURGERY_CENTER): Payer: BC Managed Care – PPO | Admitting: Gastroenterology

## 2021-09-15 VITALS — BP 116/73 | HR 71 | Temp 98.4°F | Resp 16 | Ht 71.0 in | Wt 260.0 lb

## 2021-09-15 DIAGNOSIS — D128 Benign neoplasm of rectum: Secondary | ICD-10-CM | POA: Diagnosis not present

## 2021-09-15 DIAGNOSIS — D125 Benign neoplasm of sigmoid colon: Secondary | ICD-10-CM

## 2021-09-15 DIAGNOSIS — Z1211 Encounter for screening for malignant neoplasm of colon: Secondary | ICD-10-CM | POA: Diagnosis not present

## 2021-09-15 DIAGNOSIS — D127 Benign neoplasm of rectosigmoid junction: Secondary | ICD-10-CM | POA: Diagnosis not present

## 2021-09-15 MED ORDER — SODIUM CHLORIDE 0.9 % IV SOLN: 500.0000 mL | Freq: Once | INTRAVENOUS | Status: DC

## 2021-09-15 NOTE — Op Note (Signed)
[place] Patient Name: Isaiah Beasley Procedure Date: 09/15/2021 11:34 AM MRN: [id number] Endoscopist: Nicki Reaper E. Candis Schatz , MD Age: 56 Referring MD:  Date of Birth: 1966-02-29 Gender: Male Account #: [id number] Procedure:                Colonoscopy Indications:              Screening for colorectal malignant neoplasm, This                            is the patient's first colonoscopy Medicines:                Monitored Anesthesia Care Procedure:                Pre-Anesthesia Assessment:                           - Prior to the procedure, a History and Physical                            was performed, and patient medications and                            allergies were reviewed. The patient's tolerance of                            previous anesthesia was also reviewed. The risks                            and benefits of the procedure and the sedation                            options and risks were discussed with the patient.                            All questions were answered, and informed consent                            was obtained. Prior Anticoagulants: The patient has                            taken no previous anticoagulant or antiplatelet                            agents. ASA Grade Assessment: II - A patient with                            mild systemic disease. After reviewing the risks                            and benefits, the patient was deemed in                            satisfactory condition to undergo the procedure.  After obtaining informed consent, the colonoscope                            was passed under direct vision. Throughout the                            procedure, the patient's blood pressure, pulse, and                            oxygen saturations were monitored continuously. The                            Olympus CF-HQ190L [id number] [id number]) Colonoscope was                            introduced through the  anus and advanced to the the                            terminal ileum, with identification of the                            appendiceal orifice and IC valve. The colonoscopy                            was somewhat difficult due to significant looping.                            Successful completion of the procedure was aided by                            using manual pressure. The patient tolerated the                            procedure well. The quality of the bowel                            preparation was adequate. The terminal ileum,                            ileocecal valve, appendiceal orifice, and rectum                            were photographed. The bowel preparation used was                            SUPREP via split dose instruction. Scope In: 11:51:25 AM Scope Out: 12:11:35 PM Scope Withdrawal Time: 0 hours 15 minutes 49 seconds  Total Procedure Duration: 0 hours 20 minutes 10 seconds  Findings:                 The perianal and digital rectal examinations were                            normal. Pertinent negatives include normal  sphincter tone and no palpable rectal lesions.                           Three sessile polyps were found in the rectum and                            sigmoid colon. The polyps were 2 to 4 mm in size.                            These polyps were removed with a cold snare.                            Resection and retrieval were complete. Estimated                            blood loss was minimal.                           The exam was otherwise normal throughout the                            examined colon.                           The terminal ileum appeared normal.                           The retroflexed view of the distal rectum and anal                            verge was normal and showed no anal or rectal                            abnormalities. Complications:            No immediate  complications. Estimated Blood Loss:     Estimated blood loss was minimal. Impression:               - Three 2 to 4 mm polyps in the rectum and in the                            sigmoid colon, removed with a cold snare. Resected                            and retrieved.                           - The examined portion of the ileum was normal.                           - The distal rectum and anal verge are normal on                            retroflexion view. Recommendation:           -  Patient has a contact number available for                            emergencies. The signs and symptoms of potential                            delayed complications were discussed with the                            patient. Return to normal activities tomorrow.                            Written discharge instructions were provided to the                            patient.                           - Resume previous diet.                           - Continue present medications.                           - Await pathology results.                           - Repeat colonoscopy (date not yet determined) for                            surveillance based on pathology results. [name] E. Candis Schatz, MD 09/15/2021 12:17:46 PM This report has been signed electronically.

## 2021-09-15 NOTE — Progress Notes (Signed)
Pt non-responsive, VVS, Report to RN  °

## 2021-09-15 NOTE — Progress Notes (Signed)
Called to room to assist during endoscopic procedure.  Patient ID and intended procedure confirmed with present staff. Received instructions for my participation in the procedure from the performing physician.  

## 2021-09-15 NOTE — Progress Notes (Signed)
[facility] History and Physical   Primary Care Physician:  Susy Frizzle, MD   Reason for Procedure:   Colon cancer screening  Plan:    Screening colonoscopy     HPI: Bubber Rothert is a 56 y.o. male undergoing initial average risk screening colonoscopy.  He has no family history of colon cancer and no chronic GI symptoms.    Past Medical History:  Diagnosis Date   Diabetes mellitus without complication (HCC)    Heart murmur    Hyperlipidemia    Wandering (atrial) pacemaker     Past Surgical History:  Procedure Laterality Date   APPENDECTOMY     TONSILLECTOMY     age 40    Prior to Admission medications   Medication Sig Start Date End Date Taking? Authorizing Provider  aspirin EC 81 MG tablet Take 81 mg by mouth daily. Swallow whole.   Yes [provider]  fenofibrate 160 MG tablet Take 1 tablet (160 mg total) by mouth daily. 07/12/21  Yes Susy Frizzle, MD  Insulin Pen Needle (PEN NEEDLES 3/16") 31G X 5 MM MISC Use as directed to inject Toujeo SQ 2x daily and Ozempic SQ QW. 12/28/20  Yes Susy Frizzle, MD  metFORMIN (GLUCOPHAGE) 1000 MG tablet Take 1 tablet (1,000 mg total) by mouth 2 (two) times daily with a meal. 05/24/21  Yes Susy Frizzle, MD  Omega-3 Fatty Acids (FISH OIL) 1200 MG CAPS Take by mouth in the morning and at bedtime.   Yes [provider]  ONETOUCH VERIO test strip CHECK BLOOD SUGAR 2-3 TIMES DAILY 03/09/21  Yes Susy Frizzle, MD  TOUJEO SOLOSTAR 300 UNIT/ML Solostar Pen INJECT 50 UNITS INTO THE SKIN IN THE MORNING AND AT BEDTIME. 06/14/21  Yes Susy Frizzle, MD  Continuous Blood Gluc Sensor (DEXCOM G6 SENSOR) MISC SMARTSIG:Topical Every 10 Days 09/01/21   [provider]  JARDIANCE 25 MG TABS tablet TAKE 1 TABLET BY MOUTH DAILY BEFORE BREAKFAST. 10/19/20   Susy Frizzle, MD  OZEMPIC, 1 MG/DOSE, 4 MG/3ML SOPN INJECT 1 MG INTO THE SKIN ONCE A WEEK. 10/05/20   Susy Frizzle, MD  REPATHA  SURECLICK 979 MG/ML SOAJ INJECT 140 MG INTO THE SKIN EVERY 14 (FOURTEEN) DAYS 09/09/21   Arnoldo Lenis, MD  valACYclovir (VALTREX) 500 MG tablet TAKE 1 TABLET BY MOUTH TWICE A DAY Patient taking differently: daily. 04/13/21   Susy Frizzle, MD    Current Outpatient Medications  Medication Sig Dispense Refill   aspirin EC 81 MG tablet Take 81 mg by mouth daily. Swallow whole.     fenofibrate 160 MG tablet Take 1 tablet (160 mg total) by mouth daily. 90 tablet 3   Insulin Pen Needle (PEN NEEDLES 3/16") 31G X 5 MM MISC Use as directed to inject Toujeo SQ 2x daily and Ozempic SQ QW. 300 each 3   metFORMIN (GLUCOPHAGE) 1000 MG tablet Take 1 tablet (1,000 mg total) by mouth 2 (two) times daily with a meal. 180 tablet 3   Omega-3 Fatty Acids (FISH OIL) 1200 MG CAPS Take by mouth in the morning and at bedtime.     ONETOUCH VERIO test strip CHECK BLOOD SUGAR 2-3 TIMES DAILY 300 strip 4   TOUJEO SOLOSTAR 300 UNIT/ML Solostar Pen INJECT 50 UNITS INTO THE SKIN IN THE MORNING AND AT BEDTIME. 9 mL 1   Continuous Blood Gluc Sensor (DEXCOM G6 SENSOR) MISC SMARTSIG:Topical Every 10 Days     JARDIANCE 25 MG TABS  tablet TAKE 1 TABLET BY MOUTH DAILY BEFORE BREAKFAST. 90 tablet 3   OZEMPIC, 1 MG/DOSE, 4 MG/3ML SOPN INJECT 1 MG INTO THE SKIN ONCE A WEEK. 12 mL 3   REPATHA SURECLICK 295 MG/ML SOAJ INJECT 140 MG INTO THE SKIN EVERY 14 (FOURTEEN) DAYS 6 mL 3   valACYclovir (VALTREX) 500 MG tablet TAKE 1 TABLET BY MOUTH TWICE A DAY (Patient taking differently: daily.) 180 tablet 1   Current Facility-Administered Medications  Medication Dose Route Frequency Provider Last Rate Last Admin   0.9 %  sodium chloride infusion  500 mL Intravenous Once Daryel November, MD        Allergies as of 09/15/2021 - Review Complete 09/15/2021  Allergen Reaction Noted   Lipitor [atorvastatin]  10/20/2020   Pitavastatin  10/20/2020   Zetia [ezetimibe]  10/20/2020    Family History  Problem Relation Age of Onset    Heart attack Father 13   Hyperlipidemia Father    Diabetes Father    Heart failure Father    CAD Father        stent placement   Diabetes Sister    Hyperlipidemia Sister    Valvular heart disease Sister        surgically corrected   Heart attack Brother 46   Hyperlipidemia Brother    Hypertension Brother    CAD Brother        stent placement   Hyperlipidemia Brother    Heart attack Paternal Grandmother    Colon cancer Neg Hx    Colon polyps Neg Hx    Crohn's disease Neg Hx    Esophageal cancer Neg Hx    Rectal cancer Neg Hx    Stomach cancer Neg Hx     Social History   Socioeconomic History   Marital status: Married    Spouse name: Not on file   Number of children: Not on file   Years of education: Not on file   Highest education level: Not on file  Occupational History   Not on file  Tobacco Use   Smoking status: Never    Passive exposure: Never   Smokeless tobacco: Never  Vaping Use   Vaping Use: Never used  Substance and Sexual Activity   Alcohol use: Yes    Comment: Rare   Drug use: No   Sexual activity: Not on file  Other Topics Concern   Not on file  Social History Narrative   Not on file   Social Determinants of Health   Financial Resource Strain: Not on file  Food Insecurity: Not on file  Transportation Needs: Not on file  Physical Activity: Not on file  Stress: Not on file  Social Connections: Not on file  Intimate Partner Violence: Not on file    Review of Systems:  All other review of systems negative except as mentioned in the HPI.  Physical Exam: Vital signs BP 117/65   Pulse 70   Temp 98.4 F (36.9 C) (Temporal)   Ht '5\' 11"'$  (1.803 m)   Wt 260 lb (117.9 kg)   SpO2 95%   BMI 36.26 kg/m   General:   Alert,  Well-developed, well-nourished, pleasant and cooperative in NAD Airway:  Mallampati 3 Lungs:  Clear throughout to auscultation.   Heart:  Regular rate and rhythm; no murmurs, clicks, rubs,  or gallops. Abdomen:  Soft,  nontender and nondistended. Normal bowel sounds.   Neuro/Psych:  Normal mood and affect. A and O x 3   Jina Olenick  Larwance Rote, MD [facility]

## 2021-09-15 NOTE — Progress Notes (Signed)
VS completed by DT.  Pt's states no medical or surgical changes since previsit or office visit.  

## 2021-09-15 NOTE — Patient Instructions (Signed)
Handout on polyps provided   Await pathology results.   Continue current medications.   YOU HAD AN ENDOSCOPIC PROCEDURE TODAY AT [facility]:   Refer to the procedure report that was given to you for any specific questions about what was found during the examination.  If the procedure report does not answer your questions, please call your gastroenterologist to clarify.  If you requested that your care partner not be given the details of your procedure findings, then the procedure report has been included in a sealed envelope for you to review at your convenience later.  YOU SHOULD EXPECT: Some feelings of bloating in the abdomen. Passage of more gas than usual.  Walking can help get rid of the air that was put into your GI tract during the procedure and reduce the bloating. If you had a lower endoscopy (such as a colonoscopy or flexible sigmoidoscopy) you may notice spotting of blood in your stool or on the toilet paper. If you underwent a bowel prep for your procedure, you may not have a normal bowel movement for a few days.  Please Note:  You might notice some irritation and congestion in your nose or some drainage.  This is from the oxygen used during your procedure.  There is no need for concern and it should clear up in a day or so.  SYMPTOMS TO REPORT IMMEDIATELY:  Following lower endoscopy (colonoscopy or flexible sigmoidoscopy):  Excessive amounts of blood in the stool  Significant tenderness or worsening of abdominal pains  Swelling of the abdomen that is new, acute  Fever of 100F or higher   For urgent or emergent issues, a gastroenterologist can be reached at any hour by calling [phone]. Do not use MyChart messaging for urgent concerns.    DIET:  We do recommend a small meal at first, but then you may proceed to your regular diet.  Drink plenty of fluids but you should avoid alcoholic beverages for 24 hours.  ACTIVITY:  You should plan to take it easy  for the rest of today and you should NOT DRIVE or use heavy machinery until tomorrow (because of the sedation medicines used during the test).    FOLLOW UP: Our staff will call the number listed on your records 24-72 hours following your procedure to check on you and address any questions or concerns that you may have regarding the information given to you following your procedure. If we do not reach you, we will leave a message.  We will attempt to reach you two times.  During this call, we will ask if you have developed any symptoms of COVID 19. If you develop any symptoms (ie: fever, flu-like symptoms, shortness of breath, cough etc.) before then, please call [phone].  If you test positive for Covid 19 in the 2 weeks post procedure, please call and report this information to [place].    If any biopsies were taken you will be contacted by phone or by letter within the next 1-3 weeks.  Please call us at [phone] if you have not heard about the biopsies in 3 weeks.    SIGNATURES/CONFIDENTIALITY: You and/or your care partner have signed paperwork which will be entered into your electronic medical record.  These signatures attest to the fact that that the information above on your After Visit Summary has been reviewed and is understood.  Full responsibility of the confidentiality of this discharge information lies with you and/or your care-partner.

## 2021-09-16 ENCOUNTER — Telehealth: Payer: Self-pay | Admitting: *Deleted

## 2021-09-16 NOTE — Telephone Encounter (Signed)
  Follow up Call-     09/15/2021   11:02 AM  Call back number  Post procedure Call Back phone  # [phone]  Permission to leave phone message Yes     Patient questions:  Do you have a fever, pain , or abdominal swelling? No. Pain Score  0 *  Have you tolerated food without any problems? Yes.    Have you been able to return to your normal activities? Yes.    Do you have any questions about your discharge instructions: Diet   No. Medications  No. Follow up visit  No.  Do you have questions or concerns about your Care? No.  Actions: * If pain score is 4 or above: No action needed, pain <4.

## 2021-09-23 ENCOUNTER — Encounter: Payer: BC Managed Care – PPO | Admitting: Gastroenterology

## 2021-10-07 ENCOUNTER — Other Ambulatory Visit: Payer: Self-pay | Admitting: Family Medicine

## 2021-10-07 NOTE — Telephone Encounter (Signed)
Appointment made. Requested Prescriptions  Pending Prescriptions Disp Refills  . JARDIANCE 25 MG TABS tablet [Pharmacy Med Name: JARDIANCE 25 MG TABLET] 90 tablet 0    Sig: TAKE 1 TABLET BY MOUTH EVERY DAY BEFORE BREAKFAST     Endocrinology:  Diabetes - SGLT2 Inhibitors Failed - 10/07/2021  3:15 AM      Failed - Cr in normal range and within 360 days    Creat  Date Value Ref Range Status  07/21/2020 0.50 (L) 0.70 - 1.33 mg/dL Final    Comment:    For patients >40 years of age, the reference limit for Creatinine is approximately 13% higher for people identified as African-American. .    Creatinine, Urine  Date Value Ref Range Status  01/26/2017 57 20 - 320 mg/dL Final         Failed - HBA1C is between 0 and 7.9 and within 180 days    Hgb A1C (fingerstick)  Date Value Ref Range Status  03/03/2014 7.0 (H) <5.7 % Final    Comment:                                                                           According to the ADA Clinical Practice Recommendations for 2011, when HbA1c is used as a screening test:     >=6.5%   Diagnostic of Diabetes Mellitus            (if abnormal result is confirmed)   5.7-6.4%   Increased risk of developing Diabetes Mellitus   References:Diagnosis and Classification of Diabetes Mellitus,Diabetes EFEO,7121,97(JOITG 1):S62-S69 and Standards of Medical Care in         Diabetes - 2011,Diabetes PQDI,2641,58 (Suppl 1):S11-S61.      Hgb A1c MFr Bld  Date Value Ref Range Status  07/21/2020 6.2 (H) <5.7 % of total Hgb Final    Comment:    For someone without known diabetes, a hemoglobin  A1c value between 5.7% and 6.4% is consistent with prediabetes and should be confirmed with a  follow-up test. . For someone with known diabetes, a value <7% indicates that their diabetes is well controlled. A1c targets should be individualized based on duration of diabetes, age, comorbid conditions, and other considerations. . This assay result is consistent  with an increased risk of diabetes. . Currently, no consensus exists regarding use of hemoglobin A1c for diagnosis of diabetes for children. .          Failed - eGFR in normal range and within 360 days    GFR, Est African American  Date Value Ref Range Status  07/21/2020 140 > OR = 60 mL/min/1.38m Final   GFR, Est Non African American  Date Value Ref Range Status  07/21/2020 121 > OR = 60 mL/min/1.732mFinal         Failed - Valid encounter within last 6 months    Recent Outpatient Visits          8 months ago Controlled type 2 diabetes mellitus with complication, with long-term current use of insulin ([facility]  [place] Mcgee[place]   1 year ago Controlled type 2 diabetes mellitus with complication, with long-term current use of insulin ([facility]  [place] Pickard, Cammie Mcgee, MD   1 year ago DM (diabetes mellitus), type 2, uncontrolled with complications [facility])   [place] Pickard, Cammie Mcgee, MD   2 years ago Prostate cancer screening   [place] Cammie Mcgee, MD   3 years ago Colon cancer screening   [place] Pickard, Cammie Mcgee, MD      Future Appointments            In 2 weeks Pickard, Cammie Mcgee, MD [place]

## 2021-10-18 ENCOUNTER — Other Ambulatory Visit: Payer: BC Managed Care – PPO

## 2021-10-18 DIAGNOSIS — E781 Pure hyperglyceridemia: Secondary | ICD-10-CM

## 2021-10-18 DIAGNOSIS — E118 Type 2 diabetes mellitus with unspecified complications: Secondary | ICD-10-CM

## 2021-10-18 DIAGNOSIS — I350 Nonrheumatic aortic (valve) stenosis: Secondary | ICD-10-CM | POA: Diagnosis not present

## 2021-10-21 ENCOUNTER — Ambulatory Visit: Payer: BC Managed Care – PPO | Admitting: Family Medicine

## 2021-10-21 VITALS — BP 126/78 | HR 77 | Temp 97.9°F | Ht 71.0 in | Wt 268.0 lb

## 2021-10-21 DIAGNOSIS — E7800 Pure hypercholesterolemia, unspecified: Secondary | ICD-10-CM

## 2021-10-21 DIAGNOSIS — Z794 Long term (current) use of insulin: Secondary | ICD-10-CM

## 2021-10-21 DIAGNOSIS — E781 Pure hyperglyceridemia: Secondary | ICD-10-CM

## 2021-10-21 DIAGNOSIS — E118 Type 2 diabetes mellitus with unspecified complications: Secondary | ICD-10-CM | POA: Diagnosis not present

## 2021-10-21 LAB — CBC WITH DIFFERENTIAL/PLATELET
Absolute Monocytes: 610 cells/uL (ref 200–950)
Basophils Absolute: 47 cells/uL (ref 0–200)
Basophils Relative: 0.7 %
Eosinophils Absolute: 141 cells/uL (ref 15–500)
Eosinophils Relative: 2.1 %
HCT: 47.6 % (ref 38.5–50.0)
Hemoglobin: 15.7 g/dL (ref 13.2–17.1)
Lymphs Abs: 2419 cells/uL (ref 850–3900)
MCH: 31.8 pg (ref 27.0–33.0)
MCHC: 33 g/dL (ref 32.0–36.0)
MCV: 96.6 fL (ref 80.0–100.0)
MPV: 10.6 fL (ref 7.5–12.5)
Monocytes Relative: 9.1 %
Neutro Abs: 3484 cells/uL (ref 1500–7800)
Neutrophils Relative %: 52 %
Platelets: 236 10*3/uL (ref 140–400)
RBC: 4.93 10*6/uL (ref 4.20–5.80)
RDW: 12.2 % (ref 11.0–15.0)
Total Lymphocyte: 36.1 %
WBC: 6.7 10*3/uL (ref 3.8–10.8)

## 2021-10-21 LAB — COMPREHENSIVE METABOLIC PANEL
AG Ratio: 2.2 (calc) (ref 1.0–2.5)
ALT: 17 U/L (ref 9–46)
AST: 17 U/L (ref 10–35)
Albumin: 4.4 g/dL (ref 3.6–5.1)
Alkaline phosphatase (APISO): 48 U/L (ref 35–144)
BUN/Creatinine Ratio: 24 (calc) — ABNORMAL HIGH (ref 6–22)
BUN: 12 mg/dL (ref 7–25)
CO2: 21 mmol/L (ref 20–32)
Calcium: 9.1 mg/dL (ref 8.6–10.3)
Chloride: 109 mmol/L (ref 98–110)
Creat: 0.5 mg/dL — ABNORMAL LOW (ref 0.70–1.30)
Globulin: 2 g/dL (calc) (ref 1.9–3.7)
Glucose, Bld: 104 mg/dL — ABNORMAL HIGH (ref 65–99)
Potassium: 4 mmol/L (ref 3.5–5.3)
Sodium: 143 mmol/L (ref 135–146)
Total Bilirubin: 0.4 mg/dL (ref 0.2–1.2)
Total Protein: 6.4 g/dL (ref 6.1–8.1)

## 2021-10-21 LAB — LIPID PANEL
Cholesterol: 59 mg/dL (ref <200)
HDL: 31 mg/dL — ABNORMAL LOW (ref >=40)
LDL Cholesterol (Calc): <10 mg/dL (calc)
Non-HDL Cholesterol (Calc): 28 mg/dL (calc) (ref <130)
Total CHOL/HDL Ratio: 1.9 (calc) (ref <5.0)
Triglycerides: 117 mg/dL (ref <150)

## 2021-10-21 LAB — HEMOGLOBIN A1C
Hgb A1c MFr Bld: 5.6 % of total Hgb (ref <5.7)
Mean Plasma Glucose: 114 mg/dL
eAG (mmol/L): 6.3 mmol/L

## 2021-10-21 NOTE — Progress Notes (Signed)
Subjective:    Patient ID: Isaiah Beasley, male    DOB: [birth year], 56 y.o.   MRN: [id number]   Patient is doing extremely well with his keto diet.  His blood work below reflects that.  His A1c is 5.6.  He denies any hypoglycemic episodes.  He denies any polyuria polydipsia.  He denies any chest pain or shortness of breath or dyspnea on exertion.  His blood pressure today is outstanding at 126/78.  Diabetic foot exam was performed today and is normal. Past Medical History:  Diagnosis Date   Diabetes mellitus without complication ([place])    Heart murmur    Hyperlipidemia    Wandering (atrial) pacemaker     Current Outpatient Medications on File Prior to Visit  Medication Sig Dispense Refill   aspirin EC 81 MG tablet Take 81 mg by mouth daily. Swallow whole.     Continuous Blood Gluc Sensor (DEXCOM G6 SENSOR) MISC SMARTSIG:Topical Every 10 Days     fenofibrate 160 MG tablet Take 1 tablet (160 mg total) by mouth daily. 90 tablet 3   Insulin Pen Needle (PEN NEEDLES 3/16") 31G X 5 MM MISC Use as directed to inject Toujeo SQ 2x daily and Ozempic SQ QW. 300 each 3   JARDIANCE 25 MG TABS tablet TAKE 1 TABLET BY MOUTH EVERY DAY BEFORE BREAKFAST 90 tablet 0   metFORMIN (GLUCOPHAGE) 1000 MG tablet Take 1 tablet (1,000 mg total) by mouth 2 (two) times daily with a meal. 180 tablet 3   Omega-3 Fatty Acids (FISH OIL) 1200 MG CAPS Take by mouth in the morning and at bedtime.     ONETOUCH VERIO test strip CHECK BLOOD SUGAR 2-3 TIMES DAILY 300 strip 4   OZEMPIC, 1 MG/DOSE, 4 MG/3ML SOPN INJECT 1 MG INTO THE SKIN ONCE A WEEK. 12 mL 3   REPATHA SURECLICK 093 MG/ML SOAJ INJECT 140 MG INTO THE SKIN EVERY 14 (FOURTEEN) DAYS 6 mL 3   TOUJEO SOLOSTAR 300 UNIT/ML Solostar Pen INJECT 50 UNITS INTO THE SKIN IN THE MORNING AND AT BEDTIME. 9 mL 1   valACYclovir (VALTREX) 500 MG tablet TAKE 1 TABLET BY MOUTH TWICE A DAY (Patient taking differently: daily.) 180 tablet 1   No current facility-administered medications  on file prior to visit.   Allergies  Allergen Reactions   Lipitor [Atorvastatin]     myalgias   Pitavastatin     myalgias   Zetia [Ezetimibe]     myalgias   Social History   Socioeconomic History   Marital status: Married    Spouse name: Not on file   Number of children: Not on file   Years of education: Not on file   Highest education level: Not on file  Occupational History   Not on file  Tobacco Use   Smoking status: Never    Passive exposure: Never   Smokeless tobacco: Never  Vaping Use   Vaping Use: Never used  Substance and Sexual Activity   Alcohol use: Yes    Comment: Rare   Drug use: No   Sexual activity: Not on file  Other Topics Concern   Not on file  Social History Narrative   Not on file   Social Determinants of Health   Financial Resource Strain: Not on file  Food Insecurity: Not on file  Transportation Needs: Not on file  Physical Activity: Not on file  Stress: Not on file  Social Connections: Not on file  Intimate Partner Violence: Not on file  Family History  Problem Relation Age of Onset   Heart attack Father 69   Hyperlipidemia Father    Diabetes Father    Heart failure Father    CAD Father        stent placement   Diabetes Sister    Hyperlipidemia Sister    Valvular heart disease Sister        surgically corrected   Heart attack Brother 60   Hyperlipidemia Brother    Hypertension Brother    CAD Brother        stent placement   Hyperlipidemia Brother    Heart attack Paternal Grandmother    Colon cancer Neg Hx    Colon polyps Neg Hx    Crohn's disease Neg Hx    Esophageal cancer Neg Hx    Rectal cancer Neg Hx    Stomach cancer Neg Hx       Review of Systems  All other systems reviewed and are negative.      Objective:   Physical Exam Vitals reviewed.  Constitutional:      General: He is not in acute distress.    Appearance: He is well-developed. He is not diaphoretic.  HENT:     Head: Normocephalic and  atraumatic.     Right Ear: External ear normal.     Left Ear: External ear normal.     Nose: Nose normal.     Mouth/Throat:     Pharynx: No oropharyngeal exudate.  Eyes:     General: No scleral icterus.       Right eye: No discharge.        Left eye: No discharge.     Conjunctiva/sclera: Conjunctivae normal.     Pupils: Pupils are equal, round, and reactive to light.  Neck:     Thyroid: No thyromegaly.     Vascular: No JVD.     Trachea: No tracheal deviation.  Cardiovascular:     Rate and Rhythm: Normal rate and regular rhythm.     Heart sounds: Normal heart sounds. No murmur heard.    No friction rub. No gallop.  Pulmonary:     Effort: Pulmonary effort is normal. No respiratory distress.     Breath sounds: Normal breath sounds. No stridor. No wheezing or rales.  Chest:     Chest wall: No tenderness.  Abdominal:     General: Bowel sounds are normal. There is no distension.     Palpations: Abdomen is soft. There is no mass.     Tenderness: There is no abdominal tenderness. There is no guarding or rebound.  Musculoskeletal:     Cervical back: Normal range of motion and neck supple.  Lymphadenopathy:     Cervical: No cervical adenopathy.  Neurological:     Mental Status: He is alert and oriented to person, place, and time.     Cranial Nerves: No cranial nerve deficit.     Motor: No abnormal muscle tone.     Coordination: Coordination normal.     Deep Tendon Reflexes: Reflexes are normal and symmetric.  Psychiatric:        Behavior: Behavior normal.        Thought Content: Thought content normal.        Judgment: Judgment normal.    Lab on 10/18/2021  Component Date Value Ref Range Status   Cholesterol 10/18/2021 59  <200 mg/dL Final   HDL 10/18/2021 31 (L)  > OR = 40 mg/dL Final   Triglycerides 10/18/2021 117  <  150 mg/dL Final   LDL Cholesterol (Calc) 10/18/2021 <10  mg/dL (calc) Final   Comment: Reference range: <100 . Desirable range <100 mg/dL for primary  prevention;   <70 mg/dL for patients with CHD or diabetic patients  with > or = 2 CHD risk factors. Marland Kitchen LDL-C is now calculated using the Martin-Hopkins  calculation, which is a validated novel method providing  better accuracy than the Friedewald equation in the  estimation of LDL-C.  Cresenciano Genre et al. Annamaria Helling. 1610;960(45): 2061-2068  (http://education.[facility].com/faq/FAQ164)    Total CHOL/HDL Ratio 10/18/2021 1.9  <5.0 (calc) Final   Non-HDL Cholesterol (Calc) 10/18/2021 28  <130 mg/dL (calc) Final   Comment: For patients with diabetes plus 1 major ASCVD risk  factor, treating to a non-HDL-C goal of <100 mg/dL  (LDL-C of <70 mg/dL) is considered a therapeutic  option.    WBC 10/18/2021 6.7  3.8 - 10.8 Thousand/uL Final   RBC 10/18/2021 4.93  4.20 - 5.80 Million/uL Final   Hemoglobin 10/18/2021 15.7  13.2 - 17.1 g/dL Final   HCT 10/18/2021 47.6  38.5 - 50.0 % Final   MCV 10/18/2021 96.6  80.0 - 100.0 fL Final   MCH 10/18/2021 31.8  27.0 - 33.0 pg Final   MCHC 10/18/2021 33.0  32.0 - 36.0 g/dL Final   RDW 10/18/2021 12.2  11.0 - 15.0 % Final   Platelets 10/18/2021 236  140 - 400 Thousand/uL Final   MPV 10/18/2021 10.6  7.5 - 12.5 fL Final   Neutro Abs 10/18/2021 3,484  1,500 - 7,800 cells/uL Final   Lymphs Abs 10/18/2021 2,419  850 - 3,900 cells/uL Final   Absolute Monocytes 10/18/2021 610  200 - 950 cells/uL Final   Eosinophils Absolute 10/18/2021 141  15 - 500 cells/uL Final   Basophils Absolute 10/18/2021 47  0 - 200 cells/uL Final   Neutrophils Relative % 10/18/2021 52  % Final   Total Lymphocyte 10/18/2021 36.1  % Final   Monocytes Relative 10/18/2021 9.1  % Final   Eosinophils Relative 10/18/2021 2.1  % Final   Basophils Relative 10/18/2021 0.7  % Final   Glucose, Bld 10/18/2021 104 (H)  65 - 99 mg/dL Final   Comment: .            Fasting reference interval . For someone without known diabetes, a glucose value between 100 and 125 mg/dL is consistent  with prediabetes and should be confirmed with a follow-up test. .    BUN 10/18/2021 12  7 - 25 mg/dL Final   Creat 10/18/2021 0.50 (L)  0.70 - 1.30 mg/dL Final   BUN/Creatinine Ratio 10/18/2021 24 (H)  6 - 22 (calc) Final   Sodium 10/18/2021 143  135 - 146 mmol/L Final   Potassium 10/18/2021 4.0  3.5 - 5.3 mmol/L Final   Chloride 10/18/2021 109  98 - 110 mmol/L Final   CO2 10/18/2021 21  20 - 32 mmol/L Final   Calcium 10/18/2021 9.1  8.6 - 10.3 mg/dL Final   Total Protein 10/18/2021 6.4  6.1 - 8.1 g/dL Final   Albumin 10/18/2021 4.4  3.6 - 5.1 g/dL Final   Globulin 10/18/2021 2.0  1.9 - 3.7 g/dL (calc) Final   AG Ratio 10/18/2021 2.2  1.0 - 2.5 (calc) Final   Total Bilirubin 10/18/2021 0.4  0.2 - 1.2 mg/dL Final   Alkaline phosphatase (APISO) 10/18/2021 48  35 - 144 U/L Final   AST 10/18/2021 17  10 - 35 U/L Final   ALT  10/18/2021 17  9 - 46 U/L Final   Hgb A1c MFr Bld 10/18/2021 5.6  <5.7 % of total Hgb Final   Comment: For the purpose of screening for the presence of diabetes: . <5.7%       Consistent with the absence of diabetes 5.7-6.4%    Consistent with increased risk for diabetes             (prediabetes) > or =6.5%  Consistent with diabetes . This assay result is consistent with a decreased risk of diabetes. . Currently, no consensus exists regarding use of hemoglobin A1c for diagnosis of diabetes in children. . According to American Diabetes Association (ADA) guidelines, hemoglobin A1c <7.0% represents optimal control in non-pregnant diabetic patients. Different metrics may apply to specific patient populations.  Standards of Medical Care in Diabetes(ADA). .    Mean Plasma Glucose 10/18/2021 114  mg/dL Final   eAG (mmol/L) 10/18/2021 6.3  mmol/L Final          Assessment & Plan:  Controlled type 2 diabetes mellitus with complication, with long-term current use of insulin (HCC)  Hypertriglyceridemia  Pure hypercholesterolemia I am extremely proud of  this patient and the changes made in his diet.  I have asked him to try to wean back on his insulin.  Decrease by 5 units every 4 to 5 days and monitor his sugars.  Hopefully we can keep his fasting blood sugars under 130 we can get him on less insulin which should help facilitate some weight loss.  Also discontinue fenofibrate as his triglycerides are outstanding.  I would prefer the patient stay on Repatha for prevention of heart disease.

## 2021-12-23 ENCOUNTER — Other Ambulatory Visit: Payer: Self-pay | Admitting: Family Medicine

## 2021-12-24 NOTE — Telephone Encounter (Signed)
Requested medication (s) are due for refill today: yes  Requested medication (s) are on the active medication list: yes  Last refill:  10/07/21 #90 with 0 RF  Future visit scheduled: no, seen 10/21/21  Notes to clinic:  Failed protocol of labs within 12 months, eGFR 07/21/2020, no upcoming appt, seen in July, please assess.       Requested Prescriptions  Pending Prescriptions Disp Refills   JARDIANCE 25 MG TABS tablet [Pharmacy Med Name: JARDIANCE 25 MG TABLET] 90 tablet 0    Sig: TAKE 1 TABLET BY MOUTH EVERY DAY BEFORE BREAKFAST     Endocrinology:  Diabetes - SGLT2 Inhibitors Failed - 12/23/2021 10:55 PM      Failed - Cr in normal range and within 360 days    Creat  Date Value Ref Range Status  10/18/2021 0.50 (L) 0.70 - 1.30 mg/dL Final   Creatinine, Urine  Date Value Ref Range Status  01/26/2017 57 20 - 320 mg/dL Final         Failed - eGFR in normal range and within 360 days    GFR, Est African American  Date Value Ref Range Status  07/21/2020 140 > OR = 60 mL/min/1.42m Final   GFR, Est Non African American  Date Value Ref Range Status  07/21/2020 121 > OR = 60 mL/min/1.729mFinal         Failed - Valid encounter within last 6 months    Recent Outpatient Visits           11 months ago Controlled type 2 diabetes mellitus with complication, with long-term current use of insulin ([facility]  [place] [place]gee[place]   1 year ago Controlled type 2 diabetes mellitus with complication, with long-term current use of insulin ([facility]  [place], [place]ie Mcgee[place]   1 year ago DM (diabetes mellitus), type 2, uncontrolled with complications ([facility]  [place], Wa[place]ammie McgeeMD   2 years ago Prostate cancer screening   Br[facility]andi[facility]y FrizzleMD   3 years ago Colon cancer screening   [place], [place]ammie McgeeMD              Passed - HBA1C is between 0 and  7.9 and within 180 days    Hgb A1C (fingerstick)  Date Value Ref Range Status  03/03/2014 7.0 (H) <5.7 % Final    Comment:                                                                           According to the ADA Clinical Practice Recommendations for 2011, when HbA1c is used as a screening test:     >=6.5%   Diagnostic of Diabetes Mellitus            (if abnormal result is confirmed)   5.7-6.4%   Increased risk of developing Diabetes Mellitus   References:Diagnosis and Classification of Diabetes Mellitus,Diabetes CaCBUL,8453,64(WOEHO):S62-S69 and Standards of Medical Care in         Diabetes - 2011,Diabetes CaZYYQ,8250,03Suppl 1):S11-S61.      Hgb A1c MFr Bld  Date Value Ref Range  Status  10/18/2021 5.6 <5.7 % of total Hgb Final    Comment:    For the purpose of screening for the presence of diabetes: . <5.7%       Consistent with the absence of diabetes 5.7-6.4%    Consistent with increased risk for diabetes             (prediabetes) > or =6.5%  Consistent with diabetes . This assay result is consistent with a decreased risk of diabetes. . Currently, no consensus exists regarding use of hemoglobin A1c for diagnosis of diabetes in children. . According to American Diabetes Association (ADA) guidelines, hemoglobin A1c <7.0% represents optimal control in non-pregnant diabetic patients. Different metrics may apply to specific patient populations.  Standards of Medical Care in Diabetes(ADA). Marland Kitchen

## 2022-04-22 ENCOUNTER — Other Ambulatory Visit (HOSPITAL_COMMUNITY): Payer: Self-pay

## 2022-04-25 ENCOUNTER — Other Ambulatory Visit (HOSPITAL_COMMUNITY): Payer: Self-pay

## 2022-06-20 ENCOUNTER — Other Ambulatory Visit: Payer: BC Managed Care – PPO

## 2022-06-20 DIAGNOSIS — Z794 Long term (current) use of insulin: Secondary | ICD-10-CM

## 2022-06-20 DIAGNOSIS — E781 Pure hyperglyceridemia: Secondary | ICD-10-CM | POA: Diagnosis not present

## 2022-06-20 DIAGNOSIS — R7989 Other specified abnormal findings of blood chemistry: Secondary | ICD-10-CM

## 2022-06-20 DIAGNOSIS — Z125 Encounter for screening for malignant neoplasm of prostate: Secondary | ICD-10-CM

## 2022-06-20 DIAGNOSIS — E118 Type 2 diabetes mellitus with unspecified complications: Secondary | ICD-10-CM

## 2022-06-20 DIAGNOSIS — E7800 Pure hypercholesterolemia, unspecified: Secondary | ICD-10-CM | POA: Diagnosis not present

## 2022-06-21 LAB — LIPID PANEL
Cholesterol: 109 mg/dL (ref <200)
HDL: 30 mg/dL — ABNORMAL LOW (ref >=40)
LDL Cholesterol (Calc): 48 mg/dL (calc)
Non-HDL Cholesterol (Calc): 79 mg/dL (calc) (ref <130)
Total CHOL/HDL Ratio: 3.6 (calc) (ref <5.0)
Triglycerides: 293 mg/dL — ABNORMAL HIGH (ref <150)

## 2022-06-21 LAB — CBC WITH DIFFERENTIAL/PLATELET
Absolute Monocytes: 595 cells/uL (ref 200–950)
Basophils Absolute: 50 cells/uL (ref 0–200)
Basophils Relative: 0.8 %
Eosinophils Absolute: 149 cells/uL (ref 15–500)
Eosinophils Relative: 2.4 %
HCT: 50.2 % — ABNORMAL HIGH (ref 38.5–50.0)
Hemoglobin: 16.5 g/dL (ref 13.2–17.1)
Lymphs Abs: 2195 cells/uL (ref 850–3900)
MCH: 30.2 pg (ref 27.0–33.0)
MCHC: 32.9 g/dL (ref 32.0–36.0)
MCV: 91.8 fL (ref 80.0–100.0)
MPV: 10.4 fL (ref 7.5–12.5)
Monocytes Relative: 9.6 %
Neutro Abs: 3212 cells/uL (ref 1500–7800)
Neutrophils Relative %: 51.8 %
Platelets: 227 10*3/uL (ref 140–400)
RBC: 5.47 10*6/uL (ref 4.20–5.80)
RDW: 12.3 % (ref 11.0–15.0)
Total Lymphocyte: 35.4 %
WBC: 6.2 10*3/uL (ref 3.8–10.8)

## 2022-06-21 LAB — HEMOGLOBIN A1C
Hgb A1c MFr Bld: 7 % of total Hgb — ABNORMAL HIGH (ref <5.7)
Mean Plasma Glucose: 154 mg/dL
eAG (mmol/L): 8.5 mmol/L

## 2022-06-21 LAB — COMPLETE METABOLIC PANEL WITH GFR
AG Ratio: 2.2 (calc) (ref 1.0–2.5)
ALT: 19 U/L (ref 9–46)
AST: 16 U/L (ref 10–35)
Albumin: 4.3 g/dL (ref 3.6–5.1)
Alkaline phosphatase (APISO): 57 U/L (ref 35–144)
BUN/Creatinine Ratio: 24 (calc) — ABNORMAL HIGH (ref 6–22)
BUN: 11 mg/dL (ref 7–25)
CO2: 21 mmol/L (ref 20–32)
Calcium: 8.8 mg/dL (ref 8.6–10.3)
Chloride: 106 mmol/L (ref 98–110)
Creat: 0.45 mg/dL — ABNORMAL LOW (ref 0.70–1.30)
Globulin: 2 g/dL (calc) (ref 1.9–3.7)
Glucose, Bld: 132 mg/dL — ABNORMAL HIGH (ref 65–99)
Potassium: 4 mmol/L (ref 3.5–5.3)
Sodium: 141 mmol/L (ref 135–146)
Total Bilirubin: 0.2 mg/dL (ref 0.2–1.2)
Total Protein: 6.3 g/dL (ref 6.1–8.1)
eGFR: 122 mL/min/{1.73_m2} (ref >=60)

## 2022-06-21 LAB — PSA: PSA: 0.31 ng/mL (ref <=4.00)

## 2022-06-21 LAB — TESTOSTERONE: Testosterone: 183 ng/dL — ABNORMAL LOW (ref 250–827)

## 2022-06-21 LAB — VITAMIN B12: Vitamin B-12: 731 pg/mL (ref 200–1100)

## 2022-06-23 ENCOUNTER — Other Ambulatory Visit: Payer: Self-pay | Admitting: Family Medicine

## 2022-06-23 ENCOUNTER — Ambulatory Visit: Payer: BC Managed Care – PPO | Admitting: Family Medicine

## 2022-06-23 ENCOUNTER — Encounter: Payer: Self-pay | Admitting: Family Medicine

## 2022-06-23 VITALS — BP 116/68 | HR 77 | Temp 97.8°F | Ht 71.0 in | Wt 277.2 lb

## 2022-06-23 DIAGNOSIS — E7800 Pure hypercholesterolemia, unspecified: Secondary | ICD-10-CM

## 2022-06-23 DIAGNOSIS — E669 Obesity, unspecified: Secondary | ICD-10-CM

## 2022-06-23 DIAGNOSIS — E781 Pure hyperglyceridemia: Secondary | ICD-10-CM | POA: Diagnosis not present

## 2022-06-23 DIAGNOSIS — Z794 Long term (current) use of insulin: Secondary | ICD-10-CM

## 2022-06-23 DIAGNOSIS — E118 Type 2 diabetes mellitus with unspecified complications: Secondary | ICD-10-CM

## 2022-06-23 MED ORDER — TIRZEPATIDE 10 MG/0.5ML ~~LOC~~ SOAJ: 10.0000 mg | SUBCUTANEOUS | 1 refills | Status: DC

## 2022-06-23 NOTE — Progress Notes (Signed)
Subjective:    Patient ID: Isaiah Beasley, male    DOB: 19-Oct-1965, 57 y.o.   MRN: [id number]  Lab on 06/20/2022  Component Date Value Ref Range Status   WBC 06/20/2022 6.2  3.8 - 10.8 Thousand/uL Final   RBC 06/20/2022 5.47  4.20 - 5.80 Million/uL Final   Hemoglobin 06/20/2022 16.5  13.2 - 17.1 g/dL Final   HCT 06/20/2022 50.2 (H)  38.5 - 50.0 % Final   MCV 06/20/2022 91.8  80.0 - 100.0 fL Final   MCH 06/20/2022 30.2  27.0 - 33.0 pg Final   MCHC 06/20/2022 32.9  32.0 - 36.0 g/dL Final   RDW 06/20/2022 12.3  11.0 - 15.0 % Final   Platelets 06/20/2022 227  140 - 400 Thousand/uL Final   MPV 06/20/2022 10.4  7.5 - 12.5 fL Final   Neutro Abs 06/20/2022 3,212  1,500 - 7,800 cells/uL Final   Lymphs Abs 06/20/2022 2,195  850 - 3,900 cells/uL Final   Absolute Monocytes 06/20/2022 595  200 - 950 cells/uL Final   Eosinophils Absolute 06/20/2022 149  15 - 500 cells/uL Final   Basophils Absolute 06/20/2022 50  0 - 200 cells/uL Final   Neutrophils Relative % 06/20/2022 51.8  % Final   Total Lymphocyte 06/20/2022 35.4  % Final   Monocytes Relative 06/20/2022 9.6  % Final   Eosinophils Relative 06/20/2022 2.4  % Final   Basophils Relative 06/20/2022 0.8  % Final   Glucose, Bld 06/20/2022 132 (H)  65 - 99 mg/dL Final   Comment: .            Fasting reference interval . For someone without known diabetes, a glucose value >125 mg/dL indicates that they may have diabetes and this should be confirmed with a follow-up test. .    BUN 06/20/2022 11  7 - 25 mg/dL Final   Creat 06/20/2022 0.45 (L)  0.70 - 1.30 mg/dL Final   eGFR 06/20/2022 122  > OR = 60 mL/min/1.39m2 Final   BUN/Creatinine Ratio 06/20/2022 24 (H)  6 - 22 (calc) Final   Sodium 06/20/2022 141  135 - 146 mmol/L Final   Potassium 06/20/2022 4.0  3.5 - 5.3 mmol/L Final   Chloride 06/20/2022 106  98 - 110 mmol/L Final   CO2 06/20/2022 21  20 - 32 mmol/L Final   Calcium 06/20/2022 8.8  8.6 - 10.3 mg/dL Final   Total Protein  06/20/2022 6.3  6.1 - 8.1 g/dL Final   Albumin 06/20/2022 4.3  3.6 - 5.1 g/dL Final   Globulin 06/20/2022 2.0  1.9 - 3.7 g/dL (calc) Final   AG Ratio 06/20/2022 2.2  1.0 - 2.5 (calc) Final   Total Bilirubin 06/20/2022 0.2  0.2 - 1.2 mg/dL Final   Alkaline phosphatase (APISO) 06/20/2022 57  35 - 144 U/L Final   AST 06/20/2022 16  10 - 35 U/L Final   ALT 06/20/2022 19  9 - 46 U/L Final   Hgb A1c MFr Bld 06/20/2022 7.0 (H)  <5.7 % of total Hgb Final   Comment: For someone without known diabetes, a hemoglobin A1c value of 6.5% or greater indicates that they may have  diabetes and this should be confirmed with a follow-up  test. . For someone with known diabetes, a value <7% indicates  that their diabetes is well controlled and a value  greater than or equal to 7% indicates suboptimal  control. A1c targets should be individualized based on  duration of diabetes, age, comorbid conditions,  and  other considerations. . Currently, no consensus exists regarding use of hemoglobin A1c for diagnosis of diabetes for children. .    Mean Plasma Glucose 06/20/2022 154  mg/dL Final   eAG (mmol/L) 06/20/2022 8.5  mmol/L Final   Comment: . This test was performed on the Roche cobas c503 platform. Effective 01/03/22, a change in test platforms from the Abbott Architect to the Roche cobas c503 may have shifted HbA1c results compared to historical results. Based on laboratory validation testing conducted at [place] relative to the Abbott platform had an average increase in HbA1c value of < or = 0.3%. This difference is within accepted  variability established by the [facility]. Note that not all individuals will have had a shift in their results and direct comparisons between historical and current results for testing conducted on different platforms is not recommended.    Cholesterol 06/20/2022 109  <200 mg/dL Final   HDL 06/20/2022 30 (L)   > OR = 40 mg/dL Final   Triglycerides 06/20/2022 293 (H)  <150 mg/dL Final   Comment: . If a non-fasting specimen was collected, consider repeat triglyceride testing on a fasting specimen if clinically indicated.  [name] et al. J. of Clin. Lipidol. [id number]. Marland Kitchen    LDL Cholesterol (Calc) 06/20/2022 48  mg/dL (calc) Final   Comment: Reference range: <100 . Desirable range <100 mg/dL for primary prevention;   <70 mg/dL for patients with CHD or diabetic patients  with > or = 2 CHD risk factors. Marland Kitchen LDL-C is now calculated using the Martin-Hopkins  calculation, which is a validated novel method providing  better accuracy than the Friedewald equation in the  estimation of LDL-C.  Cresenciano Genre et al. Annamaria Helling. [id number]): 2061-2068  (http://education.[facility].com/faq/FAQ164)    Total CHOL/HDL Ratio 06/20/2022 3.6  <5.0 (calc) Final   Non-HDL Cholesterol (Calc) 06/20/2022 79  <130 mg/dL (calc) Final   Comment: For patients with diabetes plus 1 major ASCVD risk  factor, treating to a non-HDL-C goal of <100 mg/dL  (LDL-C of <70 mg/dL) is considered a therapeutic  option.    Vitamin B-12 06/20/2022 731  200 - 1,100 pg/mL Final   PSA 06/20/2022 0.31  < OR = 4.00 ng/mL Final   Comment: The total PSA value from this assay system is  standardized against the WHO standard. The test  result will be approximately 20% lower when compared  to the equimolar-standardized total PSA (Beckman  Coulter). Comparison of serial PSA results should be  interpreted with this fact in mind. . This test was performed using the Siemens  chemiluminescent method. Values obtained from  different assay methods cannot be used interchangeably. PSA levels, regardless of value, should not be interpreted as absolute evidence of the presence or absence of disease.    Testosterone 06/20/2022 183 (L)  250 - 827 ng/dL Final   Comment: In hypogonadal males, Testosterone, Total, LC/MS/MS, is the recommended  assay due to the diminished accuracy of immunoassay at levels below 250 ng/dL. This test code [id number]) must be collected in a red-top tube with no gel.     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  He denies any chest pain shortness of breath or dyspnea on exertion.  His most recent A1c has risen greater than 7.  His BMI is up to 38.  This is despite taking Ozempic 1 mg subcu  weekly.  He states that he has trouble controlling his appetite.  He is not exercising.  He states that he is very sedentary during the wintertime.  He is interested in talking to a surgeon about gastric bypass he states that he struggled with his weight his entire life and he is concerned that his weight is gone to get out of control as he gets older and becomes more sedentary.  He is also taking 50 units of insulin twice daily.  He denies any hypoglycemia. Past Medical History:  Diagnosis Date   Diabetes mellitus without complication (HCC)    Heart murmur    Hyperlipidemia    Wandering (atrial) pacemaker     Current Outpatient Medications on File Prior to Visit  Medication Sig Dispense Refill   aspirin EC 81 MG tablet Take 81 mg by mouth daily. Swallow whole.     fenofibrate 160 MG tablet Take 1 tablet (160 mg total) by mouth daily. 90 tablet 3   Insulin Pen Needle (PEN NEEDLES 3/16") 31G X 5 MM MISC Use as directed to inject Toujeo SQ 2x daily and Ozempic SQ QW. 300 each 3   JARDIANCE 25 MG TABS tablet TAKE 1 TABLET BY MOUTH EVERY DAY BEFORE BREAKFAST 90 tablet 3   metFORMIN (GLUCOPHAGE) 1000 MG tablet Take 1 tablet (1,000 mg total) by mouth 2 (two) times daily with a meal. 180 tablet 3   Omega-3 Fatty Acids (FISH OIL) 1200 MG CAPS Take by mouth in the morning and at bedtime.     ONETOUCH VERIO test strip CHECK BLOOD SUGAR 2-3 TIMES DAILY 300 strip 4   REPATHA SURECLICK [id number] MG/ML SOAJ INJECT 140 MG  INTO THE SKIN EVERY 14 (FOURTEEN) DAYS 6 mL 3   TOUJEO SOLOSTAR 300 UNIT/ML Solostar Pen INJECT 50 UNITS INTO THE SKIN IN THE MORNING AND AT BEDTIME. 9 mL 1   valACYclovir (VALTREX) 500 MG tablet TAKE 1 TABLET BY MOUTH TWICE A DAY (Patient taking differently: daily.) 180 tablet 1   Continuous Blood Gluc Sensor (DEXCOM G6 SENSOR) MISC SMARTSIG:Topical Every 10 Days (Patient not taking: Reported on 06/23/2022)     No current facility-administered medications on file prior to visit.   Allergies  Allergen Reactions   Lipitor [Atorvastatin]     myalgias   Pitavastatin     myalgias   Zetia [Ezetimibe]     myalgias   Social History   Socioeconomic History   Marital status: Married    Spouse name: Not on file   Number of children: Not on file   Years of education: Not on file   Highest education level: Not on file  Occupational History   Not on file  Tobacco Use   Smoking status: Never    Passive exposure: Never   Smokeless tobacco: Never  Vaping Use   Vaping Use: Never used  Substance and Sexual Activity   Alcohol use: Yes    Comment: Rare   Drug use: No   Sexual activity: Not on file  Other Topics Concern   Not on file  Social History Narrative   Not on file   Social Determinants of Health   Financial Resource Strain: Not on file  Food Insecurity: Not on file  Transportation Needs: Not on file  Physical Activity: Not on file  Stress: Not on file  Social Connections: Not on file  Intimate Partner Violence: Not on file   Family History  Problem Relation Age of Onset   Heart attack Father 46  Hyperlipidemia Father    Diabetes Father    Heart failure Father    CAD Father        stent placement   Diabetes Sister    Hyperlipidemia Sister    Valvular heart disease Sister        surgically corrected   Heart attack Brother 26   Hyperlipidemia Brother    Hypertension Brother    CAD Brother        stent placement   Hyperlipidemia Brother    Heart attack Paternal  Grandmother    Colon cancer Neg Hx    Colon polyps Neg Hx    Crohn's disease Neg Hx    Esophageal cancer Neg Hx    Rectal cancer Neg Hx    Stomach cancer Neg Hx       Review of Systems  All other systems reviewed and are negative.      Objective:   Physical Exam Vitals reviewed.  Constitutional:      General: He is not in acute distress.    Appearance: He is well-developed. He is not diaphoretic.  HENT:     Head: Normocephalic and atraumatic.     Right Ear: External ear normal.     Left Ear: External ear normal.     Nose: Nose normal.     Mouth/Throat:     Pharynx: No oropharyngeal exudate.  Eyes:     General: No scleral icterus.       Right eye: No discharge.        Left eye: No discharge.     Conjunctiva/sclera: Conjunctivae normal.     Pupils: Pupils are equal, round, and reactive to light.  Neck:     Thyroid: No thyromegaly.     Vascular: No JVD.     Trachea: No tracheal deviation.  Cardiovascular:     Rate and Rhythm: Normal rate and regular rhythm.     Heart sounds: Normal heart sounds. No murmur heard.    No friction rub. No gallop.  Pulmonary:     Effort: Pulmonary effort is normal. No respiratory distress.     Breath sounds: Normal breath sounds. No stridor. No wheezing or rales.  Chest:     Chest wall: No tenderness.  Abdominal:     General: Bowel sounds are normal. There is no distension.     Palpations: Abdomen is soft. There is no mass.     Tenderness: There is no abdominal tenderness. There is no guarding or rebound.  Musculoskeletal:     Cervical back: Normal range of motion and neck supple.  Lymphadenopathy:     Cervical: No cervical adenopathy.  Neurological:     Mental Status: He is alert and oriented to person, place, and time.     Cranial Nerves: No cranial nerve deficit.     Motor: No abnormal muscle tone.     Coordination: Coordination normal.     Deep Tendon Reflexes: Reflexes are normal and symmetric.  Psychiatric:         Behavior: Behavior normal.        Thought Content: Thought content normal.        Judgment: Judgment normal.    Lab on 06/20/2022  Component Date Value Ref Range Status   WBC 06/20/2022 6.2  3.8 - 10.8 Thousand/uL Final   RBC 06/20/2022 5.47  4.20 - 5.80 Million/uL Final   Hemoglobin 06/20/2022 16.5  13.2 - 17.1 g/dL Final   HCT 06/20/2022 50.2 (H)  38.5 - 50.0 %  Final   MCV 06/20/2022 91.8  80.0 - 100.0 fL Final   MCH 06/20/2022 30.2  27.0 - 33.0 pg Final   MCHC 06/20/2022 32.9  32.0 - 36.0 g/dL Final   RDW 06/20/2022 12.3  11.0 - 15.0 % Final   Platelets 06/20/2022 227  140 - 400 Thousand/uL Final   MPV 06/20/2022 10.4  7.5 - 12.5 fL Final   Neutro Abs 06/20/2022 3,212  1,500 - 7,800 cells/uL Final   Lymphs Abs 06/20/2022 2,195  850 - 3,900 cells/uL Final   Absolute Monocytes 06/20/2022 595  200 - 950 cells/uL Final   Eosinophils Absolute 06/20/2022 149  15 - 500 cells/uL Final   Basophils Absolute 06/20/2022 50  0 - 200 cells/uL Final   Neutrophils Relative % 06/20/2022 51.8  % Final   Total Lymphocyte 06/20/2022 35.4  % Final   Monocytes Relative 06/20/2022 9.6  % Final   Eosinophils Relative 06/20/2022 2.4  % Final   Basophils Relative 06/20/2022 0.8  % Final   Glucose, Bld 06/20/2022 132 (H)  65 - 99 mg/dL Final   Comment: .            Fasting reference interval . For someone without known diabetes, a glucose value >125 mg/dL indicates that they may have diabetes and this should be confirmed with a follow-up test. .    BUN 06/20/2022 11  7 - 25 mg/dL Final   Creat 06/20/2022 0.45 (L)  0.70 - 1.30 mg/dL Final   eGFR 06/20/2022 122  > OR = 60 mL/min/1.91m2 Final   BUN/Creatinine Ratio 06/20/2022 24 (H)  6 - 22 (calc) Final   Sodium 06/20/2022 141  135 - 146 mmol/L Final   Potassium 06/20/2022 4.0  3.5 - 5.3 mmol/L Final   Chloride 06/20/2022 106  98 - 110 mmol/L Final   CO2 06/20/2022 21  20 - 32 mmol/L Final   Calcium 06/20/2022 8.8  8.6 - 10.3 mg/dL Final   Total  Protein 06/20/2022 6.3  6.1 - 8.1 g/dL Final   Albumin 06/20/2022 4.3  3.6 - 5.1 g/dL Final   Globulin 06/20/2022 2.0  1.9 - 3.7 g/dL (calc) Final   AG Ratio 06/20/2022 2.2  1.0 - 2.5 (calc) Final   Total Bilirubin 06/20/2022 0.2  0.2 - 1.2 mg/dL Final   Alkaline phosphatase (APISO) 06/20/2022 57  35 - 144 U/L Final   AST 06/20/2022 16  10 - 35 U/L Final   ALT 06/20/2022 19  9 - 46 U/L Final   Hgb A1c MFr Bld 06/20/2022 7.0 (H)  <5.7 % of total Hgb Final   Comment: For someone without known diabetes, a hemoglobin A1c value of 6.5% or greater indicates that they may have  diabetes and this should be confirmed with a follow-up  test. . For someone with known diabetes, a value <7% indicates  that their diabetes is well controlled and a value  greater than or equal to 7% indicates suboptimal  control. A1c targets should be individualized based on  duration of diabetes, age, comorbid conditions, and  other considerations. . Currently, no consensus exists regarding use of hemoglobin A1c for diagnosis of diabetes for children. .    Mean Plasma Glucose 06/20/2022 154  mg/dL Final   eAG (mmol/L) 06/20/2022 8.5  mmol/L Final   Comment: . This test was performed on the Roche cobas c503 platform. Effective 01/03/22, a change in test platforms from the Abbott Architect to the Roche cobas c503 may have shifted HbA1c results  compared to historical results. Based on laboratory validation testing conducted at [place] relative to the Abbott platform had an average increase in HbA1c value of < or = 0.3%. This difference is within accepted  variability established by the [facility]. Note that not all individuals will have had a shift in their results and direct comparisons between historical and current results for testing conducted on different platforms is not recommended.    Cholesterol 06/20/2022 109  <200 mg/dL Final   HDL 06/20/2022  30 (L)  > OR = 40 mg/dL Final   Triglycerides 06/20/2022 293 (H)  <150 mg/dL Final   Comment: . If a non-fasting specimen was collected, consider repeat triglyceride testing on a fasting specimen if clinically indicated.  [name] et al. J. of Clin. Lipidol. [id number]. Marland Kitchen    LDL Cholesterol (Calc) 06/20/2022 48  mg/dL (calc) Final   Comment: Reference range: <100 . Desirable range <100 mg/dL for primary prevention;   <70 mg/dL for patients with CHD or diabetic patients  with > or = 2 CHD risk factors. Marland Kitchen LDL-C is now calculated using the Martin-Hopkins  calculation, which is a validated novel method providing  better accuracy than the Friedewald equation in the  estimation of LDL-C.  Cresenciano Genre et al. Annamaria Helling. [id number]): 2061-2068  (http://education.[facility].com/faq/FAQ164)    Total CHOL/HDL Ratio 06/20/2022 3.6  <5.0 (calc) Final   Non-HDL Cholesterol (Calc) 06/20/2022 79  <130 mg/dL (calc) Final   Comment: For patients with diabetes plus 1 major ASCVD risk  factor, treating to a non-HDL-C goal of <100 mg/dL  (LDL-C of <70 mg/dL) is considered a therapeutic  option.    Vitamin B-12 06/20/2022 731  200 - 1,100 pg/mL Final   PSA 06/20/2022 0.31  < OR = 4.00 ng/mL Final   Comment: The total PSA value from this assay system is  standardized against the WHO standard. The test  result will be approximately 20% lower when compared  to the equimolar-standardized total PSA (Beckman  Coulter). Comparison of serial PSA results should be  interpreted with this fact in mind. . This test was performed using the Siemens  chemiluminescent method. Values obtained from  different assay methods cannot be used interchangeably. PSA levels, regardless of value, should not be interpreted as absolute evidence of the presence or absence of disease.    Testosterone 06/20/2022 183 (L)  250 - 827 ng/dL Final   Comment: In hypogonadal males, Testosterone, Total, LC/MS/MS, is the  recommended assay due to the diminished accuracy of immunoassay at levels below 250 ng/dL. This test code [id number]) must be collected in a red-top tube with no gel.            Assessment & Plan:  Obesity (BMI 30-39.9) - Plan: Amb Referral to Bariatric Surgery  Controlled type 2 diabetes mellitus with complication, with long-term current use of insulin (HCC)  Hypertriglyceridemia  Pure hypercholesterolemia I will be happy to make a referral for him to discuss weight loss surgery with the bariatric surgeon.  However I encouraged him to try to get 30 minutes of exercise every day.  I do not feel that he will lose weight until he is exercising.  I also recommended discontinuation of Ozempic and switching to Mounjaro 10 mg subcu weekly.  Uptitrate as tolerated 50 mg subcu weekly.  LDL cholesterol is excellent.  Diabetic foot exam is normal.  Blood pressure is excellent.

## 2022-06-30 ENCOUNTER — Other Ambulatory Visit: Payer: BC Managed Care – PPO

## 2022-07-31 ENCOUNTER — Encounter: Payer: Self-pay | Admitting: Family Medicine

## 2022-08-02 ENCOUNTER — Other Ambulatory Visit: Payer: Self-pay

## 2022-08-02 DIAGNOSIS — E781 Pure hyperglyceridemia: Secondary | ICD-10-CM

## 2022-08-02 MED ORDER — REPATHA SURECLICK 140 MG/ML ~~LOC~~ SOAJ: 140.0000 mg | SUBCUTANEOUS | 3 refills | Status: DC

## 2022-10-13 IMAGING — CT CT ANGIO CHEST
2 series · 18 of 32 positions shown · IV contrast (APPLIED)
Comparison: CT of the chest on 05/05/2017

CLINICAL DATA: Heart murmur and evidence of bicuspid, calcified
aortic valve by echocardiography with associated stenosis.
Borderline dilatation of the aortic root by echocardiography.

EXAM:
CT ANGIOGRAPHY CHEST WITH CONTRAST
TECHNIQUE: Multidetector CT imaging of the chest was performed using the
standard protocol during bolus administration of intravenous
contrast. Multiplanar CT image reconstructions and MIPs were
obtained to evaluate the vascular anatomy.
CONTRAST:  75mL BZS9BW-7RI IOPAMIDOL (BZS9BW-7RI) INJECTION 76%
Creatinine was obtained on site at [HOSPITAL] at [HOSPITAL].
Results: Creatinine 0.6 mg/dL.  Estimated GFR 113 mL/minute.

[Series 4: chest angio · axial · 0.79mm/px · z∈[-326,-74]mm · 11 of 102 slices shown]
[im 9/102  lung]
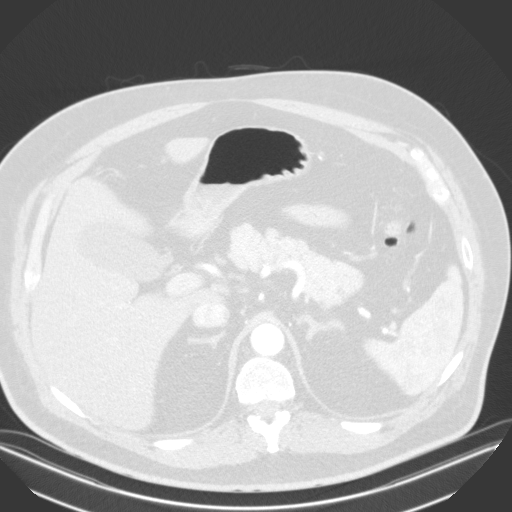
[im 17/102  soft-tissue]
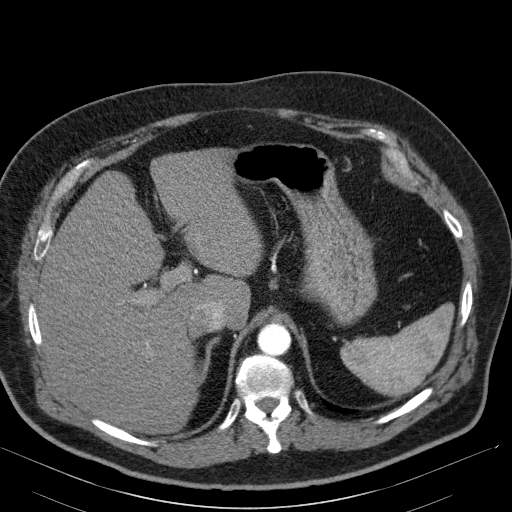
[im 26/102  lung]
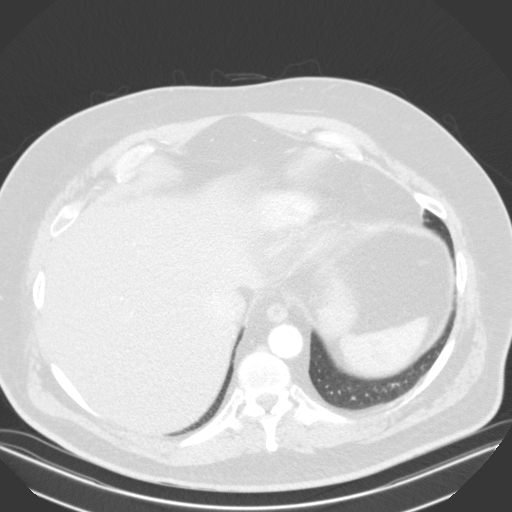
[im 34/102  soft-tissue]
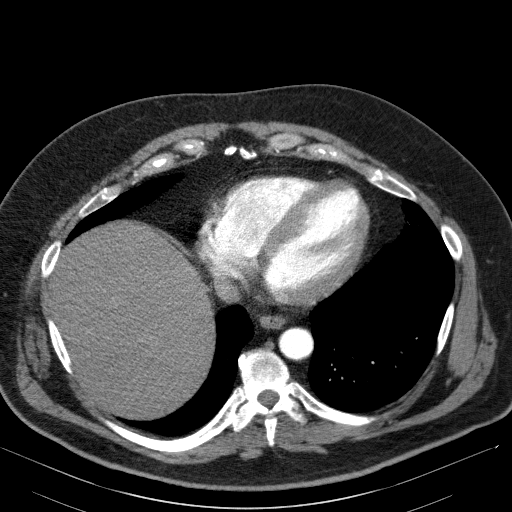
[im 43/102  lung]
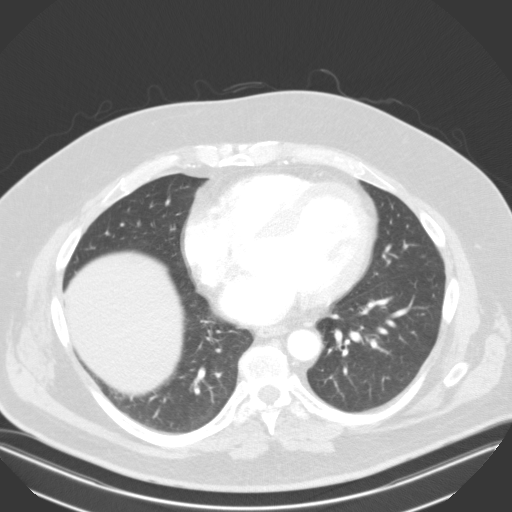
[im 51/102  soft-tissue]
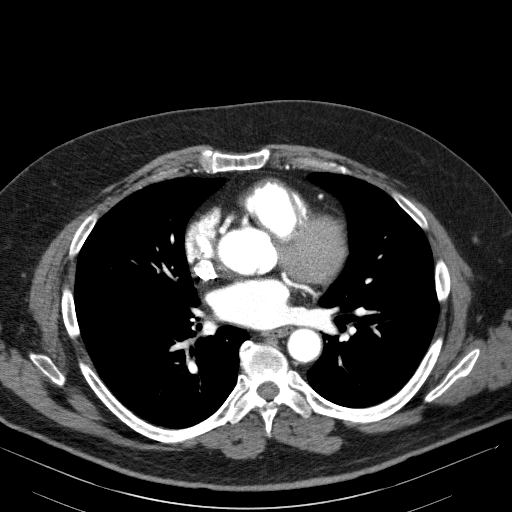
[im 59/102  lung]
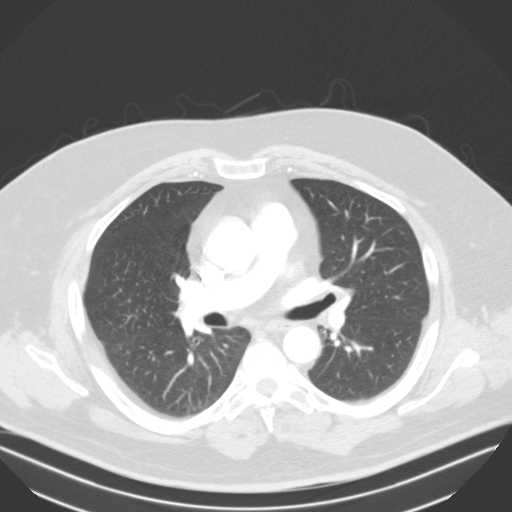
[im 68/102  soft-tissue]
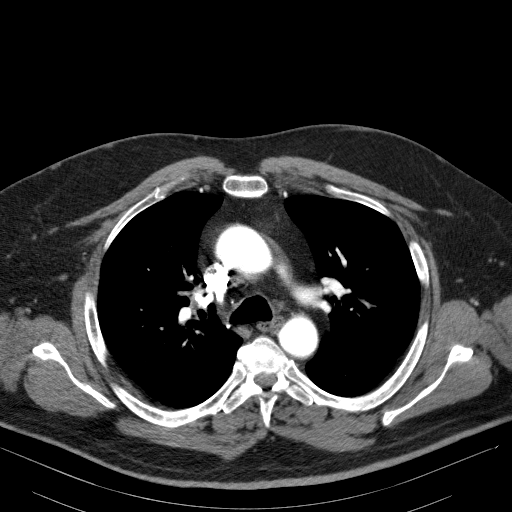
[im 76/102  lung]
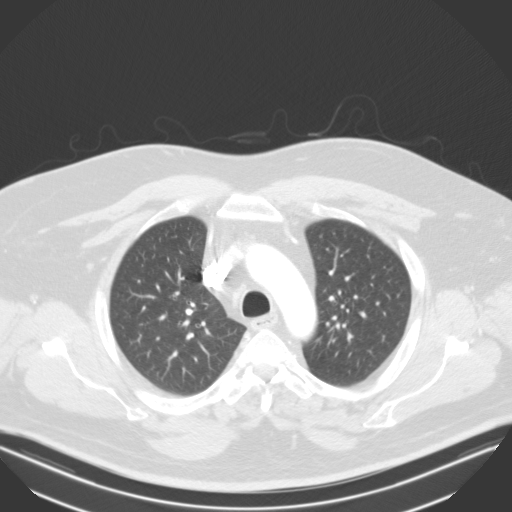
[im 85/102  soft-tissue]
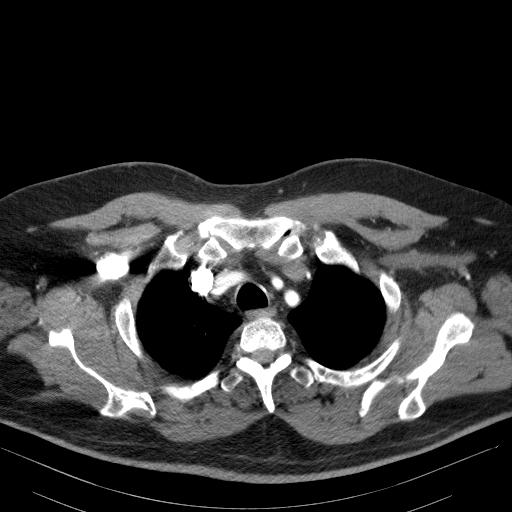
[im 93/102  lung]
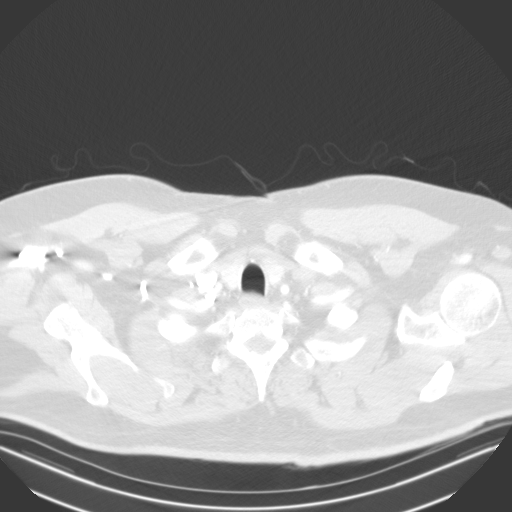

[Series 11: lung · axial · 0.79mm/px · z∈[-313,-115]mm · 7 of 150 slices shown]
[im 17/150  soft-tissue]
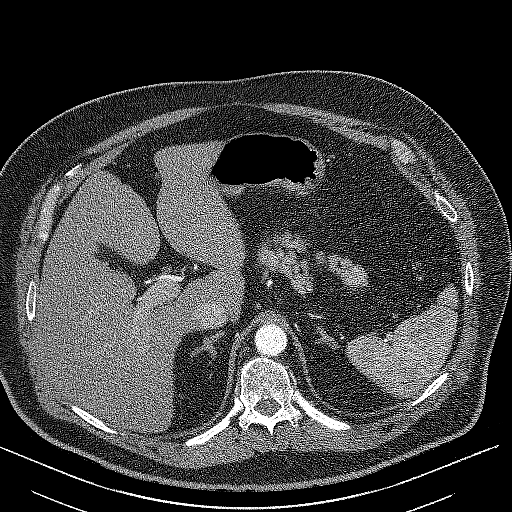
[im 34/150  soft-tissue]
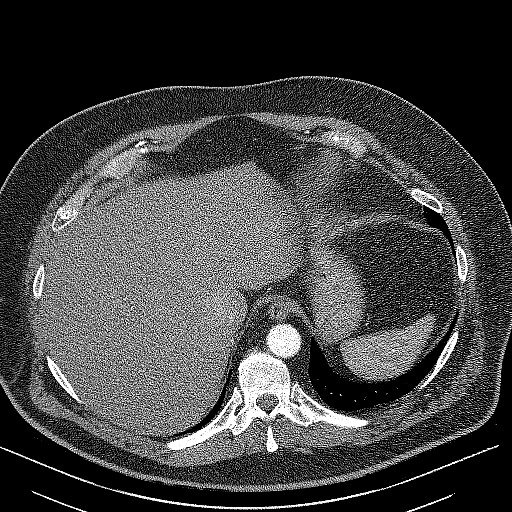
[im 50/150  soft-tissue]
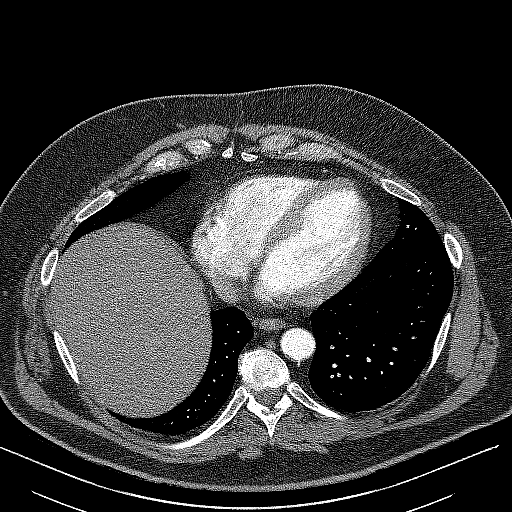
[im 67/150  soft-tissue]
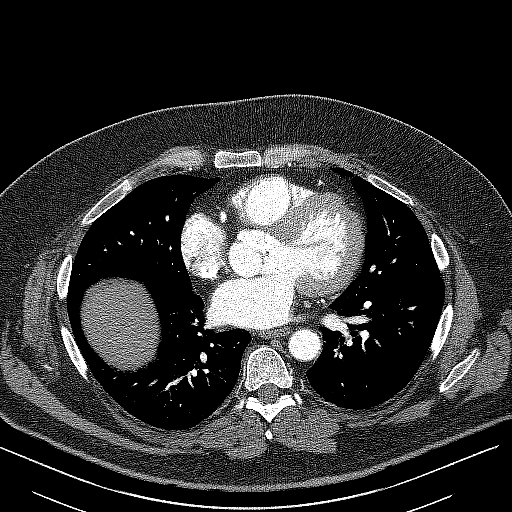
[im 83/150  soft-tissue]
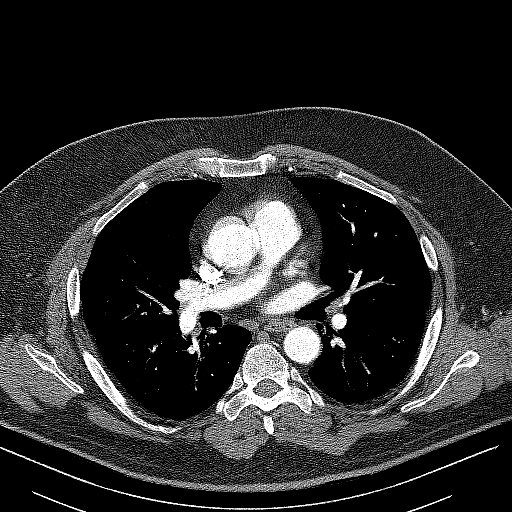
[im 100/150  soft-tissue]
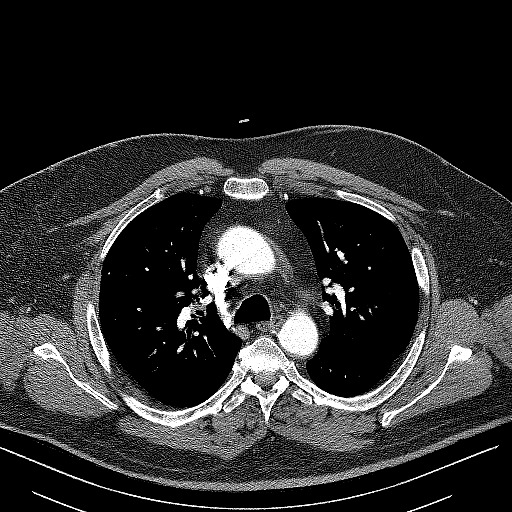
[im 116/150  soft-tissue]
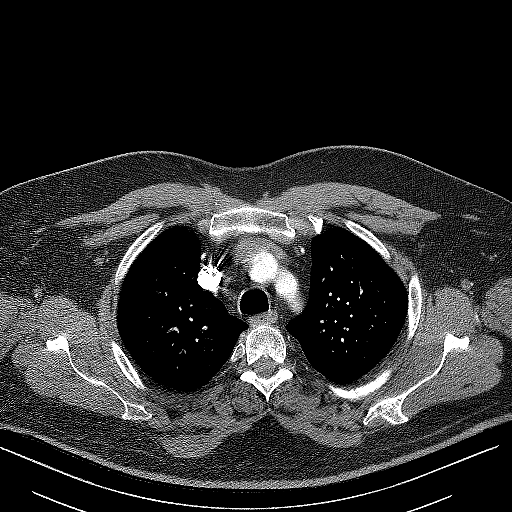

[18 of 32 positions shown; findings below may reference images not displayed]

FINDINGS: Cardiovascular: The aortic valve is thickened and heavily calcified
especially anteriorly. This is progressive since the prior CTA in
0300. The aortic root measures 3.9-4.0 cm in estimated diameter at
the level of the sinuses of Valsalva. The ascending thoracic aorta
is normal in caliber and measures 3.5-3.6 cm in greatest caliber.
The proximal arch measures 3 cm and the distal arch 2.9 cm. The
descending thoracic aorta measures 2.7 cm. There is no evidence of
aortic dissection. Visualized proximal great vessels demonstrate
bovine branching anatomy and normal patency.

The heart size is within normal limits. No pericardial fluid
identified. No significant calcified coronary artery plaque is
identified. There may be a minimal amount of calcified plaque in the
distribution of the distal LAD but this segment is partially blurred
by motion artifact. Central pulmonary arteries are normal in
caliber.

Mediastinum/Nodes: No enlarged mediastinal, hilar, or axillary lymph
nodes. Heavily calcified right hilar lymph nodes are consistent with
prior granulomatous disease. Thyroid gland, trachea, and esophagus
demonstrate no significant findings.

Lungs/Pleura: Stable adjacent calcified granulomata in the anterior
and inferior aspect of the right upper lobe nearly abutting the
minor fissure. There is no evidence of pulmonary edema,
consolidation, pneumothorax or pleural fluid.

Upper Abdomen: No acute abnormality.

Musculoskeletal: No chest wall abnormality. No acute or significant
osseous findings.

Review of the MIP images confirms the above findings.
IMPRESSION: 1. Heavily calcified aortic valve with progressive thickening and
calcification of the aortic valve since the prior CTA in 0300.
2. The aortic root measures 3.9-4.0 cm in greatest diameter at the
level of the sinuses of Valsalva. The ascending thoracic aorta is
normal in caliber and measures 3.5-3.6 cm in greatest caliber.
3. No significant calcified coronary artery plaque is identified.
There may be a minimal amount of calcified plaque in the
distribution of the distal LAD but this segment is partially blurred
by motion artifact.
4. Stable calcified granulomata in the right upper lobe and
calcified right hilar lymph nodes consistent with prior
granulomatous disease.

## 2022-11-08 ENCOUNTER — Encounter: Payer: Self-pay | Admitting: Family Medicine

## 2022-11-08 ENCOUNTER — Other Ambulatory Visit: Payer: Self-pay

## 2022-11-08 DIAGNOSIS — E118 Type 2 diabetes mellitus with unspecified complications: Secondary | ICD-10-CM

## 2022-11-08 MED ORDER — SEMAGLUTIDE (1 MG/DOSE) 4 MG/3ML ~~LOC~~ SOPN: 4.0000 mg | PEN_INJECTOR | SUBCUTANEOUS | 1 refills | Status: DC

## 2022-11-30 ENCOUNTER — Telehealth: Payer: Self-pay

## 2022-11-30 DIAGNOSIS — L919 Hypertrophic disorder of the skin, unspecified: Secondary | ICD-10-CM | POA: Insufficient documentation

## 2022-11-30 NOTE — Telephone Encounter (Signed)
Jearld Shines (Key: [id number])  Your information has been sent to [employer].

## 2022-11-30 NOTE — Telephone Encounter (Signed)
PA FOR OZEMPIC SENT TO [employer]. MJP,LPN

## 2023-04-14 ENCOUNTER — Other Ambulatory Visit: Payer: Self-pay

## 2023-04-20 ENCOUNTER — Ambulatory Visit: Payer: BC Managed Care – PPO | Admitting: Family Medicine

## 2023-04-20 VITALS — BP 128/72 | HR 85 | Temp 98.1°F | Ht 71.0 in | Wt 278.0 lb

## 2023-04-20 DIAGNOSIS — Z7984 Long term (current) use of oral hypoglycemic drugs: Secondary | ICD-10-CM

## 2023-04-20 DIAGNOSIS — E118 Type 2 diabetes mellitus with unspecified complications: Secondary | ICD-10-CM

## 2023-04-20 MED ORDER — TIRZEPATIDE 5 MG/0.5ML ~~LOC~~ SOAJ: 5.0000 mg | SUBCUTANEOUS | 1 refills | Status: AC

## 2023-04-20 NOTE — Progress Notes (Signed)
Subjective:    Patient ID: Isaiah Beasley, male    DOB: [birth year], 58 y.o.   MRN: [id number]  Patient's hemoglobin A1c has gradually crept up to 7.1.  His LDL cholesterol is less than 55.  His triglycerides are 171.  His urine protein creatinine ratio is less than 30.  Creatinine is outstanding with a GFR greater than 100.  However the patient continues to have an elevated BMI.  Blood pressure today is outstanding at 128/72.  Diabetic foot exam is normal.  Patient has a strong family history of coronary artery disease including a brother who had a heart attack in his 33s. Past Medical History:  Diagnosis Date   Diabetes mellitus without complication (HCC)    Heart murmur    Hyperlipidemia    Wandering (atrial) pacemaker     Current Outpatient Medications on File Prior to Visit  Medication Sig Dispense Refill   aspirin EC 81 MG tablet Take 81 mg by mouth daily. Swallow whole.     Empagliflozin-metFORMIN HCl ER 12.07-998 MG TB24 Take by mouth.     Evolocumab (REPATHA SURECLICK) 140 MG/ML SOAJ Inject 140 mg into the skin every 14 (fourteen) days. 6 mL 3   fenofibrate 160 MG tablet Take 1 tablet (160 mg total) by mouth daily. 90 tablet 3   insulin glargine-yfgn (SEMGLEE) 100 UNIT/ML Pen Inject into the skin. 50 units BID 04/20/2023.     Insulin Pen Needle (PEN NEEDLES 3/16") 31G X 5 MM MISC Use as directed to inject Toujeo SQ 2x daily and Ozempic SQ QW. 300 each 3   Omega-3 Fatty Acids (FISH OIL) 1200 MG CAPS Take by mouth in the morning and at bedtime.     ONETOUCH VERIO test strip CHECK BLOOD SUGAR 2-3 TIMES DAILY 300 strip 4   TOUJEO SOLOSTAR 300 UNIT/ML Solostar Pen INJECT 50 UNITS INTO THE SKIN IN THE MORNING AND AT BEDTIME. 9 mL 1   valACYclovir (VALTREX) 500 MG tablet TAKE 1 TABLET BY MOUTH TWICE A DAY (Patient taking differently: daily.) 180 tablet 1   No current facility-administered medications on file prior to visit.   Allergies  Allergen Reactions   Lipitor [Atorvastatin]      myalgias   Pitavastatin     myalgias   Zetia [Ezetimibe]     myalgias   Social History   Socioeconomic History   Marital status: Married    Spouse name: Not on file   Number of children: Not on file   Years of education: Not on file   Highest education level: Not on file  Occupational History   Not on file  Tobacco Use   Smoking status: Never    Passive exposure: Never   Smokeless tobacco: Never  Vaping Use   Vaping status: Never Used  Substance and Sexual Activity   Alcohol use: Yes    Comment: Rare   Drug use: No   Sexual activity: Not on file  Other Topics Concern   Not on file  Social History Narrative   Not on file   Social Drivers of Health   Financial Resource Strain: Not on file  Food Insecurity: Not on file  Transportation Needs: Not on file  Physical Activity: Not on file  Stress: Not on file  Social Connections: Not on file  Intimate Partner Violence: Not on file   Family History  Problem Relation Age of Onset   Heart attack Father 60   Hyperlipidemia Father    Diabetes Father  Heart failure Father    CAD Father        stent placement   Diabetes Sister    Hyperlipidemia Sister    Valvular heart disease Sister        surgically corrected   Heart attack Brother 76   Hyperlipidemia Brother    Hypertension Brother    CAD Brother        stent placement   Hyperlipidemia Brother    Heart attack Paternal Grandmother    Colon cancer Neg Hx    Colon polyps Neg Hx    Crohn's disease Neg Hx    Esophageal cancer Neg Hx    Rectal cancer Neg Hx    Stomach cancer Neg Hx       Review of Systems  All other systems reviewed and are negative.      Objective:   Physical Exam Vitals reviewed.  Constitutional:      General: He is not in acute distress.    Appearance: He is well-developed. He is not diaphoretic.  HENT:     Head: Normocephalic and atraumatic.     Right Ear: External ear normal.     Left Ear: External ear normal.     Nose:  Nose normal.     Mouth/Throat:     Pharynx: No oropharyngeal exudate.  Eyes:     General: No scleral icterus.       Right eye: No discharge.        Left eye: No discharge.     Conjunctiva/sclera: Conjunctivae normal.     Pupils: Pupils are equal, round, and reactive to light.  Neck:     Thyroid: No thyromegaly.     Vascular: No JVD.     Trachea: No tracheal deviation.  Cardiovascular:     Rate and Rhythm: Normal rate and regular rhythm.     Heart sounds: Normal heart sounds. No murmur heard.    No friction rub. No gallop.  Pulmonary:     Effort: Pulmonary effort is normal. No respiratory distress.     Breath sounds: Normal breath sounds. No stridor. No wheezing or rales.  Chest:     Chest wall: No tenderness.  Abdominal:     General: Bowel sounds are normal. There is no distension.     Palpations: Abdomen is soft. There is no mass.     Tenderness: There is no abdominal tenderness. There is no guarding or rebound.  Musculoskeletal:     Cervical back: Normal range of motion and neck supple.  Lymphadenopathy:     Cervical: No cervical adenopathy.  Neurological:     Mental Status: He is alert and oriented to person, place, and time.     Cranial Nerves: No cranial nerve deficit.     Motor: No abnormal muscle tone.     Coordination: Coordination normal.     Deep Tendon Reflexes: Reflexes are normal and symmetric.  Psychiatric:        Behavior: Behavior normal.        Thought Content: Thought content normal.        Judgment: Judgment normal.         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  I am also very happy with his blood pressure.  His hemoglobin A1c and weight could be better.  I recommended switching Ozempic 2 mg subcu weekly to Mounjaro 5 mg  subcu weekly and uptitrate gradually as tolerated to the maximum allowable dose to try to facilitate  additional weight loss.  I recommended that the patient also get a CT scan to evaluate and calculate a coronary artery calcium score given his strong family history.

## 2023-04-24 ENCOUNTER — Encounter (HOSPITAL_COMMUNITY): Payer: Self-pay

## 2023-04-24 ENCOUNTER — Ambulatory Visit (HOSPITAL_COMMUNITY): Payer: Self-pay

## 2023-05-04 ENCOUNTER — Other Ambulatory Visit: Payer: Self-pay | Admitting: Hospitalist

## 2023-05-04 DIAGNOSIS — Z0189 Encounter for other specified special examinations: Secondary | ICD-10-CM

## 2023-05-11 ENCOUNTER — Other Ambulatory Visit (HOSPITAL_COMMUNITY): Payer: BC Managed Care – PPO

## 2023-05-17 ENCOUNTER — Telehealth (HOSPITAL_COMMUNITY): Payer: Self-pay | Admitting: *Deleted

## 2023-05-17 MED ORDER — METOPROLOL TARTRATE 100 MG PO TABS: ORAL_TABLET | ORAL | 0 refills | Status: AC

## 2023-05-17 NOTE — Telephone Encounter (Signed)
 Reaching out to patient to offer assistance regarding upcoming cardiac imaging study; pt verbalizes understanding of appt date/time, parking situation and where to check in, pre-test NPO status and medications ordered, and verified current allergies; name and call back number provided for further questions should they arise Johney Frame RN Navigator Cardiac Imaging [facility] [phone] office [phone] cell

## 2023-05-18 ENCOUNTER — Ambulatory Visit (HOSPITAL_COMMUNITY)
Admission: RE | Admit: 2023-05-18 | Discharge: 2023-05-18 | Disposition: A | Discharge status: Home / Self Care | Payer: No Typology Code available for payment source | Source: Ambulatory Visit | Attending: Hospitalist | Admitting: Hospitalist

## 2023-05-18 DIAGNOSIS — Z0189 Encounter for other specified special examinations: Secondary | ICD-10-CM | POA: Diagnosis present

## 2023-05-18 DIAGNOSIS — I2510 Atherosclerotic heart disease of native coronary artery without angina pectoris: Secondary | ICD-10-CM | POA: Diagnosis not present

## 2023-05-18 MED ORDER — NITROGLYCERIN 0.4 MG SL SUBL
SUBLINGUAL_TABLET | SUBLINGUAL | Status: AC
  Filled 2023-05-18: qty 2

## 2023-05-18 MED ORDER — METOPROLOL TARTRATE 5 MG/5ML IV SOLN
5.0000 mg | INTRAVENOUS | Status: DC | PRN
  Administered 2023-05-18: 5 mg via INTRAVENOUS

## 2023-05-18 MED ORDER — IOHEXOL 350 MG/ML SOLN
100.0000 mL | Freq: Once | INTRAVENOUS | Status: AC | PRN
  Administered 2023-05-18: 100 mL via INTRAVENOUS

## 2023-05-18 MED ORDER — METOPROLOL TARTRATE 5 MG/5ML IV SOLN
INTRAVENOUS | Status: AC
  Filled 2023-05-18: qty 5

## 2023-05-18 MED ORDER — NITROGLYCERIN 0.4 MG SL SUBL
0.8000 mg | SUBLINGUAL_TABLET | Freq: Once | SUBLINGUAL | Status: AC
  Administered 2023-05-18: 0.8 mg via SUBLINGUAL

## 2023-05-28 ENCOUNTER — Encounter: Payer: Self-pay | Admitting: Family Medicine

## 2023-07-22 ENCOUNTER — Other Ambulatory Visit: Payer: Self-pay | Admitting: Family Medicine

## 2023-07-22 DIAGNOSIS — E781 Pure hyperglyceridemia: Secondary | ICD-10-CM

## 2023-07-24 NOTE — Telephone Encounter (Signed)
 Requested medication (s) are due for refill today - yes  Requested medication (s) are on the active medication list -yes  Future visit scheduled -yes  Last refill: 08/02/22 6ml 3RF  Notes to clinic: fails lab protocol- over 1 year- 06/20/22  Requested Prescriptions  Pending Prescriptions Disp Refills   REPATHA  SURECLICK 140 MG/ML SOAJ [Pharmacy Med Name: REPATHA  140 MG/ML SURECLICK]  3    Sig: Inject 140 mg into the skin every 14 (fourteen) days.     Cardiovascular: PCSK9 Inhibitors Failed - 07/24/2023 12:03 PM      Failed - Valid encounter within last 12 months    Recent Outpatient Visits           3 months ago Type 2 diabetes mellitus with unspecified complications [facility])   [place] [facility] Austine Lefort, MD   1 year ago Obesity (BMI 30-39.9)   [place] [facility] Pickard, Cisco Crest, MD   1 year ago Controlled type 2 diabetes mellitus with complication, with long-term current use of insulin  [facility])   [place] [facility] Austine Lefort, MD              Failed - Lipid Panel completed within the last 12 months    Cholesterol, Total  Date Value Ref Range Status  04/21/2021 81 (L) 100 - 199 mg/dL Final   Cholesterol  Date Value Ref Range Status  06/20/2022 109 <200 mg/dL Final   LDL Cholesterol (Calc)  Date Value Ref Range Status  06/20/2022 48 mg/dL (calc) Final    Comment:    Reference range: <100 . Desirable range <100 mg/dL for primary prevention;   <70 mg/dL for patients with CHD or diabetic patients  with > or = 2 CHD risk factors. Aaron Aas LDL-C is now calculated using the Martin-Hopkins  calculation, which is a validated novel method providing  better accuracy than the Friedewald equation in the  estimation of LDL-C.  Melinda Sprawls et al. Erroll Heard. 4098;119(14): 2061-2068  (http://education.[facility].com/faq/FAQ164)    HDL  Date Value Ref Range Status  06/20/2022 30 (L) > OR = 40 mg/dL Final   78/29/5621 28 (L) >39 mg/dL Final   Triglycerides  Date Value Ref Range Status  06/20/2022 293 (H) <150 mg/dL Final    Comment:    . If a non-fasting specimen was collected, consider repeat triglyceride testing on a fasting specimen if clinically indicated.  Imagene Mam et al. J. of Clin. Lipidol. 2015;9:129-169. Aaron Aas             Requested Prescriptions  Pending Prescriptions Disp Refills   REPATHA  SURECLICK 140 MG/ML SOAJ [Pharmacy Med Name: REPATHA  140 MG/ML SURECLICK]  3    Sig: Inject 140 mg into the skin every 14 (fourteen) days.     Cardiovascular: PCSK9 Inhibitors Failed - 07/24/2023 12:03 PM      Failed - Valid encounter within last 12 months    Recent Outpatient Visits           3 months ago Type 2 diabetes mellitus with unspecified complications [facility])   [place] [facility] Medicine Austine Lefort, MD   1 year ago Obesity (BMI 30-39.9)   [place] [facility] Austine Lefort, MD   1 year ago Controlled type 2 diabetes mellitus with complication, with long-term current use of insulin  [facility])   [place] [facility] Pickard, Cisco Crest, MD  Failed - Lipid Panel completed within the last 12 months    Cholesterol, Total  Date Value Ref Range Status  04/21/2021 81 (L) 100 - 199 mg/dL Final   Cholesterol  Date Value Ref Range Status  06/20/2022 109 <200 mg/dL Final   LDL Cholesterol (Calc)  Date Value Ref Range Status  06/20/2022 48 mg/dL (calc) Final    Comment:    Reference range: <100 . Desirable range <100 mg/dL for primary prevention;   <70 mg/dL for patients with CHD or diabetic patients  with > or = 2 CHD risk factors. Aaron Aas LDL-C is now calculated using the Martin-Hopkins  calculation, which is a validated novel method providing  better accuracy than the Friedewald equation in the  estimation of LDL-C.  Melinda Sprawls et al. Erroll Heard. 1610;960(45): 2061-2068   (http://education.[facility].com/faq/FAQ164)    HDL  Date Value Ref Range Status  06/20/2022 30 (L) > OR = 40 mg/dL Final  40/98/1191 28 (L) >39 mg/dL Final   Triglycerides  Date Value Ref Range Status  06/20/2022 293 (H) <150 mg/dL Final    Comment:    . If a non-fasting specimen was collected, consider repeat triglyceride testing on a fasting specimen if clinically indicated.  Imagene Mam et al. J. of Clin. Lipidol. 2015;9:129-169. Aaron Aas

## 2023-10-19 ENCOUNTER — Ambulatory Visit: Payer: BC Managed Care – PPO | Admitting: Family Medicine

## 2023-12-01 ENCOUNTER — Other Ambulatory Visit (HOSPITAL_COMMUNITY): Payer: Self-pay
